# Patient Record
Sex: Male | Born: 1980 | Race: Black or African American | Hispanic: No | Marital: Married | Smoking: Current every day smoker
Health system: Southern US, Community
[De-identification: ages and names within clinical notes are randomized; demographics above are authoritative.]

## PROBLEM LIST (undated history)

## (undated) DIAGNOSIS — Z789 Other specified health status: Secondary | ICD-10-CM

## (undated) HISTORY — PX: NO PAST SURGERIES: SHX2092

---

## 2005-01-08 ENCOUNTER — Emergency Department (HOSPITAL_COMMUNITY): Admission: EM | Admit: 2005-01-08 | Discharge: 2005-01-08 | Payer: Self-pay | Admitting: Emergency Medicine

## 2008-01-29 ENCOUNTER — Inpatient Hospital Stay (HOSPITAL_COMMUNITY): Admission: AC | Admit: 2008-01-29 | Discharge: 2008-02-01 | Payer: Self-pay
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
## 2020-02-26 ENCOUNTER — Ambulatory Visit
Admission: EM | Admit: 2020-02-26 | Discharge: 2020-02-26 | Disposition: A | Discharge status: Home / Self Care | Payer: Self-pay | Attending: Physician Assistant | Admitting: Physician Assistant

## 2020-02-26 ENCOUNTER — Other Ambulatory Visit: Payer: Self-pay

## 2020-02-26 DIAGNOSIS — G510 Bell's palsy: Secondary | ICD-10-CM

## 2020-02-26 MED ORDER — PREDNISONE 20 MG PO TABS: 60.0000 mg | ORAL_TABLET | Freq: Every day | ORAL | 0 refills | Status: AC

## 2020-02-26 NOTE — ED Provider Notes (Signed)
[facility]    CSN: [id number] Arrival date & time: 02/26/20  1249      History   Chief Complaint Chief Complaint  Patient presents with  . Facial Droop    HPI Mark Dodson is a 39 y.o. male.   39 year old male comes in with wife for 4-5 day history of right facial drooping. States unable to close eye completely. Had trouble speaking at first due to trouble moving right side of lips. Denies vision changes, one sided extremity weakness, changes in balance, confusion. Denies URI symptoms, fever. Does have dental pain with worries for abscess.      History reviewed. No pertinent past medical history.  There are no problems to display for this patient.   History reviewed. No pertinent surgical history.     Home Medications    Prior to Admission medications   Medication Sig Start Date End Date Taking? Authorizing Provider  predniSONE (DELTASONE) 20 MG tablet Take 3 tablets (60 mg total) by mouth daily with breakfast for 7 days. 02/26/20 03/04/20  Belinda Fisher, PA-C    Family History Family History  Problem Relation Age of Onset  . Thyroid disease Mother   . Kidney disease Father     Social History Social History   Tobacco Use  . Smoking status: Current Every Day Smoker  . Smokeless tobacco: Never Used  Substance Use Topics  . Alcohol use: Yes  . Drug use: Yes    Types: Marijuana     Allergies   Patient has no known allergies.   Review of Systems Review of Systems  Reason unable to perform ROS: See HPI as above.     Physical Exam Triage Vital Signs ED Triage Vitals  Enc Vitals Group     BP      Pulse      Resp      Temp      Temp src      SpO2      Weight      Height      Head Circumference      Peak Flow      Pain Score      Pain Loc      Pain Edu?      Excl. in GC?    No data found.  Updated Vital Signs BP 137/89   Pulse 72   Temp 98.2 F (36.8 C) (Oral)   Resp 14   SpO2 98%   Physical Exam Constitutional:        General: He is not in acute distress.    Appearance: Normal appearance. He is not ill-appearing, toxic-appearing or diaphoretic.  HENT:     Head: Normocephalic and atraumatic.     Mouth/Throat:     Mouth: Mucous membranes are moist.     Pharynx: Oropharynx is clear. Uvula midline.     Comments: No dental tenderness to palpation. No fluctuance felt.  Eyes:     Extraocular Movements: Extraocular movements intact.     Conjunctiva/sclera: Conjunctivae normal.     Pupils: Pupils are equal, round, and reactive to light.  Cardiovascular:     Rate and Rhythm: Normal rate and regular rhythm.     Heart sounds: Normal heart sounds. No murmur heard.  No friction rub. No gallop.   Pulmonary:     Effort: Pulmonary effort is normal. No accessory muscle usage, prolonged expiration, respiratory distress or retractions.     Comments: Lungs clear to auscultation without adventitious  lung sounds. Musculoskeletal:     Cervical back: Normal range of motion and neck supple.  Neurological:     General: No focal deficit present.     Mental Status: He is alert and oriented to person, place, and time.     Comments: Right sided facial droop. Unable to raise right eyebrow, close right eye. No other deficits. Strength 5/5 bilaterally for upper and lower extremity. Sensation intact. Normal coordination with normal finger to nose, heel to shin. Negative pronator drift, romberg. Gait intact. Able to ambulate on own without difficulty.        UC Treatments / Results  Labs (all labs ordered are listed, but only abnormal results are displayed) Labs Reviewed - No data to display  EKG   Radiology No results found.  Procedures Procedures (including critical care time)  Medications Ordered in UC Medications - No data to display  Initial Impression / Assessment and Plan / UC Course  I have reviewed the triage vital signs and the nursing notes.  Pertinent labs & imaging results that were available  during my care of the patient were reviewed by me and considered in my medical decision making (see chart for details).    History and exam consistent with bell's palsy. Prednisone as directed. Other symptomatic treatment as discussed. Return precautions given.  Final Clinical Impressions(s) / UC Diagnoses   Final diagnoses:  Bell's palsy   ED Prescriptions    Medication Sig Dispense Auth. Provider   predniSONE (DELTASONE) 20 MG tablet Take 3 tablets (60 mg total) by mouth daily with breakfast for 7 days. 21 tablet Belinda Fisher, PA-C     PDMP not reviewed this encounter.   Belinda Fisher, PA-C 02/26/20 1540

## 2020-02-26 NOTE — Discharge Instructions (Signed)
Start prednisone as directed. Use artifical tear gel (systane, genteal brands)/drops for dry eyes. Tape eye down at night to sleep. If having worsening symptoms with confusion, one sided weakness, off balance, go to the emergency department for further evaluation.

## 2020-02-26 NOTE — ED Triage Notes (Addendum)
Patient presents with right sided facial droop that he states he noticed about 4-5 days ago. He does note inability to close right eye and he did have some slurred speech at onset.  He reports also having a abscessed tooth on a the right upper molar.

## 2024-02-11 ENCOUNTER — Ambulatory Visit (HOSPITAL_COMMUNITY)
Admission: EM | Admit: 2024-02-11 | Discharge: 2024-02-12 | Disposition: A | Discharge status: Other Acute Inpatient Hospital | Attending: Nurse Practitioner | Admitting: Nurse Practitioner

## 2024-02-11 DIAGNOSIS — F1010 Alcohol abuse, uncomplicated: Secondary | ICD-10-CM | POA: Diagnosis not present

## 2024-02-11 DIAGNOSIS — F22 Delusional disorders: Secondary | ICD-10-CM

## 2024-02-11 DIAGNOSIS — F323 Major depressive disorder, single episode, severe with psychotic features: Secondary | ICD-10-CM | POA: Insufficient documentation

## 2024-02-11 DIAGNOSIS — F1994 Other psychoactive substance use, unspecified with psychoactive substance-induced mood disorder: Secondary | ICD-10-CM | POA: Insufficient documentation

## 2024-02-11 DIAGNOSIS — R443 Hallucinations, unspecified: Secondary | ICD-10-CM

## 2024-02-11 DIAGNOSIS — F209 Schizophrenia, unspecified: Secondary | ICD-10-CM

## 2024-02-11 LAB — TSH: TSH: 0.454 u[IU]/mL (ref 0.350–4.500)

## 2024-02-11 LAB — POCT URINE DRUG SCREEN - MANUAL ENTRY (I-SCREEN)
POC Amphetamine UR: NOT DETECTED
POC Buprenorphine (BUP): NOT DETECTED
POC Cocaine UR: NOT DETECTED
POC Marijuana UR: POSITIVE — AB
POC Methadone UR: NOT DETECTED
POC Methamphetamine UR: NOT DETECTED
POC Morphine: NOT DETECTED
POC Oxazepam (BZO): NOT DETECTED
POC Oxycodone UR: NOT DETECTED
POC Secobarbital (BAR): NOT DETECTED

## 2024-02-11 LAB — CBC WITH DIFFERENTIAL/PLATELET
Abs Immature Granulocytes: 0.02 K/uL (ref 0.00–0.07)
Basophils Absolute: 0 K/uL (ref 0.0–0.1)
Basophils Relative: 1 %
Eosinophils Absolute: 0 K/uL (ref 0.0–0.5)
Eosinophils Relative: 0 %
HCT: 36.6 % — ABNORMAL LOW (ref 39.0–52.0)
Hemoglobin: 13.3 g/dL (ref 13.0–17.0)
Immature Granulocytes: 0 %
Lymphocytes Relative: 20 %
Lymphs Abs: 1 K/uL (ref 0.7–4.0)
MCH: 32.8 pg (ref 26.0–34.0)
MCHC: 36.3 g/dL — ABNORMAL HIGH (ref 30.0–36.0)
MCV: 90.1 fL (ref 80.0–100.0)
Monocytes Absolute: 0.5 K/uL (ref 0.1–1.0)
Monocytes Relative: 10 %
Neutro Abs: 3.4 K/uL (ref 1.7–7.7)
Neutrophils Relative %: 69 %
Platelets: 123 K/uL — ABNORMAL LOW (ref 150–400)
RBC: 4.06 MIL/uL — ABNORMAL LOW (ref 4.22–5.81)
RDW: 13 % (ref 11.5–15.5)
WBC: 4.9 K/uL (ref 4.0–10.5)
nRBC: 0 % (ref 0.0–0.2)

## 2024-02-11 LAB — COMPREHENSIVE METABOLIC PANEL WITH GFR
ALT: 95 U/L — ABNORMAL HIGH (ref 0–44)
AST: 149 U/L — ABNORMAL HIGH (ref 15–41)
Albumin: 4.1 g/dL (ref 3.5–5.0)
Alkaline Phosphatase: 71 U/L (ref 38–126)
Anion gap: 16 — ABNORMAL HIGH (ref 5–15)
BUN: 5 mg/dL — ABNORMAL LOW (ref 6–20)
CO2: 24 mmol/L (ref 22–32)
Calcium: 9.6 mg/dL (ref 8.9–10.3)
Chloride: 100 mmol/L (ref 98–111)
Creatinine, Ser: 0.64 mg/dL (ref 0.61–1.24)
GFR, Estimated: >60 mL/min (ref >60)
Glucose, Bld: 98 mg/dL (ref 70–99)
Potassium: 3.8 mmol/L (ref 3.5–5.1)
Sodium: 140 mmol/L (ref 135–145)
Total Bilirubin: 1 mg/dL (ref 0.0–1.2)
Total Protein: 7.2 g/dL (ref 6.5–8.1)

## 2024-02-11 LAB — LIPID PANEL
Cholesterol: 213 mg/dL — ABNORMAL HIGH (ref 0–200)
HDL: 122 mg/dL (ref >40)
LDL Cholesterol: 77 mg/dL (ref 0–99)
Total CHOL/HDL Ratio: 1.7 ratio
Triglycerides: 68 mg/dL (ref <150)
VLDL: 14 mg/dL (ref 0–40)

## 2024-02-11 LAB — HEMOGLOBIN A1C
Hgb A1c MFr Bld: 5.9 % — ABNORMAL HIGH (ref 4.8–5.6)
Mean Plasma Glucose: 122.63 mg/dL

## 2024-02-11 LAB — ETHANOL: Alcohol, Ethyl (B): 177 mg/dL — ABNORMAL HIGH (ref <15)

## 2024-02-11 MED ORDER — LORAZEPAM 2 MG/ML IJ SOLN: 2.0000 mg | Freq: Three times a day (TID) | INTRAMUSCULAR | Status: DC | PRN

## 2024-02-11 MED ORDER — HALOPERIDOL LACTATE 5 MG/ML IJ SOLN: 5.0000 mg | Freq: Three times a day (TID) | INTRAMUSCULAR | Status: DC | PRN

## 2024-02-11 MED ORDER — OLANZAPINE 5 MG PO TABS
5.0000 mg | ORAL_TABLET | Freq: Every day | ORAL | Status: DC
  Administered 2024-02-11: 5 mg via ORAL
  Filled 2024-02-11: qty 1

## 2024-02-11 MED ORDER — ACETAMINOPHEN 325 MG PO TABS: 650.0000 mg | ORAL_TABLET | Freq: Four times a day (QID) | ORAL | Status: DC | PRN

## 2024-02-11 MED ORDER — DIPHENHYDRAMINE HCL 50 MG/ML IJ SOLN: 50.0000 mg | Freq: Three times a day (TID) | INTRAMUSCULAR | Status: DC | PRN

## 2024-02-11 MED ORDER — MAGNESIUM HYDROXIDE 400 MG/5ML PO SUSP: 30.0000 mL | Freq: Every day | ORAL | Status: DC | PRN

## 2024-02-11 MED ORDER — CHLORDIAZEPOXIDE HCL 25 MG PO CAPS: 25.0000 mg | ORAL_CAPSULE | Freq: Four times a day (QID) | ORAL | Status: DC | PRN

## 2024-02-11 MED ORDER — HYDROXYZINE HCL 25 MG PO TABS: 25.0000 mg | ORAL_TABLET | Freq: Four times a day (QID) | ORAL | Status: DC | PRN

## 2024-02-11 MED ORDER — ALUM & MAG HYDROXIDE-SIMETH 200-200-20 MG/5ML PO SUSP: 30.0000 mL | ORAL | Status: DC | PRN

## 2024-02-11 MED ORDER — HALOPERIDOL 5 MG PO TABS: 5.0000 mg | ORAL_TABLET | Freq: Three times a day (TID) | ORAL | Status: DC | PRN

## 2024-02-11 MED ORDER — HYDROXYZINE HCL 25 MG PO TABS
25.0000 mg | ORAL_TABLET | Freq: Three times a day (TID) | ORAL | Status: DC | PRN
  Administered 2024-02-11: 25 mg via ORAL
  Filled 2024-02-11: qty 1

## 2024-02-11 MED ORDER — TRAZODONE HCL 50 MG PO TABS
50.0000 mg | ORAL_TABLET | Freq: Every evening | ORAL | Status: DC | PRN
  Administered 2024-02-11: 50 mg via ORAL
  Filled 2024-02-11: qty 1

## 2024-02-11 MED ORDER — CHLORDIAZEPOXIDE HCL 25 MG PO CAPS
25.0000 mg | ORAL_CAPSULE | Freq: Four times a day (QID) | ORAL | Status: DC
  Administered 2024-02-12 (×2): 25 mg via ORAL
  Filled 2024-02-11 (×2): qty 1

## 2024-02-11 MED ORDER — CHLORDIAZEPOXIDE HCL 25 MG PO CAPS: 25.0000 mg | ORAL_CAPSULE | Freq: Three times a day (TID) | ORAL | Status: DC

## 2024-02-11 MED ORDER — CHLORDIAZEPOXIDE HCL 25 MG PO CAPS: 25.0000 mg | ORAL_CAPSULE | ORAL | Status: DC

## 2024-02-11 MED ORDER — ADULT MULTIVITAMIN W/MINERALS CH
1.0000 | ORAL_TABLET | Freq: Every day | ORAL | Status: DC
  Administered 2024-02-12: 1 via ORAL
  Filled 2024-02-11: qty 1

## 2024-02-11 MED ORDER — DIPHENHYDRAMINE HCL 50 MG PO CAPS: 50.0000 mg | ORAL_CAPSULE | Freq: Three times a day (TID) | ORAL | Status: DC | PRN

## 2024-02-11 MED ORDER — HALOPERIDOL LACTATE 5 MG/ML IJ SOLN: 10.0000 mg | Freq: Three times a day (TID) | INTRAMUSCULAR | Status: DC | PRN

## 2024-02-11 MED ORDER — ONDANSETRON 4 MG PO TBDP: 4.0000 mg | ORAL_TABLET | Freq: Four times a day (QID) | ORAL | Status: DC | PRN

## 2024-02-11 MED ORDER — LOPERAMIDE HCL 2 MG PO CAPS: 2.0000 mg | ORAL_CAPSULE | ORAL | Status: DC | PRN

## 2024-02-11 MED ORDER — CHLORDIAZEPOXIDE HCL 25 MG PO CAPS: 25.0000 mg | ORAL_CAPSULE | Freq: Every day | ORAL | Status: DC

## 2024-02-11 MED ORDER — THIAMINE HCL 100 MG/ML IJ SOLN
100.0000 mg | Freq: Once | INTRAMUSCULAR | Status: AC
  Administered 2024-02-12: 100 mg via INTRAMUSCULAR
  Filled 2024-02-11: qty 2

## 2024-02-11 NOTE — ED Notes (Signed)
 Patient currently sleeping and resting in recliner. RR even and unlabored, appearing in no noted distress. Environmental check complete

## 2024-02-11 NOTE — ED Provider Notes (Signed)
 [facility] Continuous Assessment Admission H&P  Date: 02/12/24 Patient Name: Mark Dodson MRN: [id number] Chief Complaint:  I'm  hearing things like somebody is trying to harm me..  Diagnoses:  Final diagnoses:  Paranoid ideation (HCC)  Hallucinations  Substance induced mood disorder (HCC)  Alcohol abuse    HPI: Mark Dodson is a 43 year old male with no documented psychiatric history, who presented voluntarily as a walk in to [facility] accompanied by his wife with complaints of paranoia, visual and auditory hallucinations  for about three weeks.   Patient was seen face to face by this provider and chart reviewed. Patient gave permission for his wife to remain in the room during this evaluation.   On presentation, patient appears anxious and reports I'm hearing things like somebody is after me, and the voices are saying they gonna kill me, do harm to me, beat the hell out of me, I feel like somebody is trying to do something to me.   Patient also reports seeing people through his windows and in the compound who are there to harm him. Patient denies knowledge of having any conflict with anyone.  He reports being at work today and was unable to take it, because he really felt like he was in danger.  He had to call his wife to get him help.  Patient presented to the facility in his work clothes.   Patient endorses alcohol abuse and cigarette smoking. He reports drinking 2-16 ounce beers with 4 shots of Hennessy or vodka daily.  He reports smoking half a pack of cigarettes daily.  Patient reports hearing a voice during the evaluation, and describes the voice as one of those trying to get him.  Patient and his wife are currently separated but he visits during the weekends to spend time with the kids.  Patient is not established with outpatient psychiatric services for medication management or therapy.  He does not take any medications.  Patient has no history of inpatient  psychiatric hospitalization.  On evaluation, patient is alert, oriented x 3, and cooperative. Speech is clear, and coherent. Pt appears appropriately dressed. Eye contact is good. Mood is anxious, affect is congruent with mood. Thought process is coherent and thought content is WDL. Pt denies SI/HI, endorses AVH. There is no objective indication that the patient is responding to internal stimuli. No delusions elicited during this assessment.    Collateral information is obtained from the patient's wife who reports this situation has been ongoing for about 3 weeks. She reports lying on the couch on e day and she heard a knock on her door. When she answered, her brother and the police were out there stating her husband had called saying someone was outside threatening to kill him.  The all searched around and found no one and even looked at the neighbors cameras but there was no one.  Patient eventually left with her brother.   She reports getting a call from the patient a week later when she was at work.  She states he sounded really terrified and asked her to come get him because he was in danger as people were trying to kill him.  She reports going over to his work and he refused to sit inside the car but opted to get in the trunk for his safety before they could drive home. She reports the situation repeated itself yesterday when he was at work and called again, shaking and scared and said he can't take it  anymore and he needed help. She reports when they got to the car outside the facility, the patient was hypervigilant and scared.  He asked her to look around the corners of the building to ensure nobody was hiding that could be a danger to him.  She reports at some point during these episodes, the patient would report seeing people outside the bathroom window or in the compound who are there to harm him. She reports he specifically stated he saw 3 people outside the bathroom window and when they checked  the cameras and outside there was no one there.  She describes the patient as very nonconfrontational and lives a routine life.  She reports they have been together for 8 years and this is the first time he is experiencing this. They both deny a family history of mental illness or schizophrenia.  She reports his quality of life has been affected greatly.   Discussed recommendation for inpatient psychiatric admission for stabilization and treatment.  Discussed inpatient milieu and expectations.  Patient and his wife were provided with opportunity for questions.  They verbalized understanding and are in agreement. Patient admitted to the continuous observation unit for safety monitoring pending transfer to an inpatient psychiatric unit. LCSW will seek bed placement.  Total Time spent with patient: 30 minutes  Musculoskeletal  Strength & Muscle Tone: within normal limits Gait & Station: normal Patient leans: N/A  Psychiatric Specialty Exam  Presentation General Appearance:  Casual  Eye Contact: Good  Speech: Clear and Coherent  Speech Volume: Normal  Handedness: Right   Mood and Affect  Mood: Anxious  Affect: Congruent   Thought Process  Thought Processes: Coherent  Descriptions of Associations:Intact  Orientation:Full (Time, Place and Person)  Thought Content:Paranoid Ideation  Diagnosis of Schizophrenia or Schizoaffective disorder in past: No   Hallucinations:Hallucinations: Auditory; Visual Description of Auditory Hallucinations: Pt reports hearing voices threatning to harm and kill him Description of Visual Hallucinations: Patient also reports seeing people around his house threatning to harm him.  Ideas of Reference:Paranoia; Percusatory  Suicidal Thoughts:Suicidal Thoughts: No  Homicidal Thoughts:Homicidal Thoughts: No   Sensorium  Memory: Immediate Fair  Judgment: Fair  Insight: Fair   Producer, television/film/video: Fair  Attention Span: Fair  Recall: [employer] of Knowledge: Fair  Language: Fair   Psychomotor Activity  Psychomotor Activity: Psychomotor Activity: Normal   Assets  Assets: Manufacturing systems engineer; Desire for Improvement   Sleep  Sleep: Sleep: Poor   Nutritional Assessment (For OBS and FBC admissions only) Has the patient had a weight loss or gain of 10 pounds or more in the last 3 months?: No Has the patient had a decrease in food intake/or appetite?: No Does the patient have dental problems?: No Does the patient have eating habits or behaviors that may be indicators of an eating disorder including binging or inducing vomiting?: No Has the patient recently lost weight without trying?: 0 Has the patient been eating poorly because of a decreased appetite?: 0 Malnutrition Screening Tool Score: 0    Physical Exam Constitutional:      General: He is not in acute distress.    Appearance: He is not diaphoretic.  HENT:     Nose: No congestion.  Cardiovascular:     Rate and Rhythm: Normal rate.  Pulmonary:     Effort: No respiratory distress.  Chest:     Chest wall: No tenderness.  Neurological:     General: No focal deficit present.  Mental Status: He is alert.  Psychiatric:        Attention and Perception: He perceives auditory and visual hallucinations.        Mood and Affect: Mood is anxious.        Speech: Speech normal.        Behavior: Behavior is actively hallucinating. Behavior is cooperative.        Thought Content: Thought content is paranoid.    Review of Systems  Constitutional:  Negative for chills, diaphoresis and fever.  HENT:  Negative for congestion.   Eyes:  Negative for discharge.  Respiratory:  Negative for cough, shortness of breath and wheezing.   Cardiovascular:  Negative for chest pain and palpitations.  Gastrointestinal:  Negative for diarrhea, nausea and vomiting.  Neurological:  Negative for dizziness,  seizures, loss of consciousness and headaches.  Psychiatric/Behavioral:  Positive for hallucinations and substance abuse. The patient is nervous/anxious and has insomnia.     Blood pressure 115/67, pulse 84, temperature 98.1 F (36.7 C), temperature source Oral, resp. rate 16, SpO2 96%. There is no height or weight on file to calculate BMI.  Past Psychiatric History: See H & P   Is the patient at risk to self? Yes  Has the patient been a risk to self in the past 6 months? No .    Has the patient been a risk to self within the distant past? No   Is the patient a risk to others? No   Has the patient been a risk to others in the past 6 months? No   Has the patient been a risk to others within the distant past? No   Past Medical History: See Chart  Family History: N/A  Social History: N/A  Last Labs:  Admission on 02/11/2024  Component Date Value Ref Range Status   WBC 02/11/2024 4.9  4.0 - 10.5 K/uL Final   RBC 02/11/2024 4.06 (L)  4.22 - 5.81 MIL/uL Final   Hemoglobin 02/11/2024 13.3  13.0 - 17.0 g/dL Final   HCT 90/95/7974 36.6 (L)  39.0 - 52.0 % Final   MCV 02/11/2024 90.1  80.0 - 100.0 fL Final   MCH 02/11/2024 32.8  26.0 - 34.0 pg Final   MCHC 02/11/2024 36.3 (H)  30.0 - 36.0 g/dL Final   RDW 90/95/7974 13.0  11.5 - 15.5 % Final   Platelets 02/11/2024 123 (L)  150 - 400 K/uL Final   nRBC 02/11/2024 0.0  0.0 - 0.2 % Final   Neutrophils Relative % 02/11/2024 69  % Final   Neutro Abs 02/11/2024 3.4  1.7 - 7.7 K/uL Final   Lymphocytes Relative 02/11/2024 20  % Final   Lymphs Abs 02/11/2024 1.0  0.7 - 4.0 K/uL Final   Monocytes Relative 02/11/2024 10  % Final   Monocytes Absolute 02/11/2024 0.5  0.1 - 1.0 K/uL Final   Eosinophils Relative 02/11/2024 0  % Final   Eosinophils Absolute 02/11/2024 0.0  0.0 - 0.5 K/uL Final   Basophils Relative 02/11/2024 1  % Final   Basophils Absolute 02/11/2024 0.0  0.0 - 0.1 K/uL Final   Immature Granulocytes 02/11/2024 0  % Final   Abs  Immature Granulocytes 02/11/2024 0.02  0.00 - 0.07 K/uL Final   Performed at [facility], [address], [place], [place] [address]   Sodium 02/11/2024 140  135 - 145 mmol/L Final   Potassium 02/11/2024 3.8  3.5 - 5.1 mmol/L Final   Chloride 02/11/2024 100  98 - 111 mmol/L Final   CO2 02/11/2024 24  22 - 32 mmol/L Final   Glucose, Bld 02/11/2024 98  70 - 99 mg/dL Final   Glucose reference range applies only to samples taken after fasting for at least 8 hours.   BUN 02/11/2024 5 (L)  6 - 20 mg/dL Final   Creatinine, Ser 02/11/2024 0.64  0.61 - 1.24 mg/dL Final   Calcium 90/95/7974 9.6  8.9 - 10.3 mg/dL Final   Total Protein 90/95/7974 7.2  6.5 - 8.1 g/dL Final   Albumin 90/95/7974 4.1  3.5 - 5.0 g/dL Final   AST 90/95/7974 149 (H)  15 - 41 U/L Final   ALT 02/11/2024 95 (H)  0 - 44 U/L Final   Alkaline Phosphatase 02/11/2024 71  38 - 126 U/L Final   Total Bilirubin 02/11/2024 1.0  0.0 - 1.2 mg/dL Final   GFR, Estimated 02/11/2024 >60  >60 mL/min Final   Comment: (NOTE) Calculated using the CKD-EPI Creatinine Equation (2021)    Anion gap 02/11/2024 16 (H)  5 - 15 Final   Performed at [facility], [address] [place], [place] [address]   Hgb A1c MFr Bld 02/11/2024 5.9 (H)  4.8 - 5.6 % Final   Comment: (NOTE) Diagnosis of Diabetes The following HbA1c ranges recommended by the American Diabetes Association (ADA) may be used as an aid in the diagnosis of diabetes mellitus.  Hemoglobin             Suggested A1C NGSP%              Diagnosis  <5.7                   Non Diabetic  5.7-6.4                Pre-Diabetic  >6.4                   Diabetic  <7.0                   Glycemic control for                       adults with diabetes.     Mean Plasma Glucose 02/11/2024 122.63  mg/dL Final   Performed at [facility], [address] [place], [place] [address]   Cholesterol 02/11/2024 213 (H)  0 - 200 mg/dL Final   Triglycerides 90/95/7974 68  <150 mg/dL  Final   HDL 90/95/7974 122  >40 mg/dL Final   Total CHOL/HDL Ratio 02/11/2024 1.7  RATIO Final   VLDL 02/11/2024 14  0 - 40 mg/dL Final   LDL Cholesterol 02/11/2024 77  0 - 99 mg/dL Final   Comment:        Total Cholesterol/HDL:CHD Risk Coronary Heart Disease Risk Table                     Men   Women  1/2 Average Risk   3.4   3.3  Average Risk       5.0   4.4  2 X Average Risk   9.6   7.1  3 X Average Risk  23.4   11.0        Use the calculated Patient Ratio above and the CHD Risk Table to determine the patient's CHD Risk.        ATP III CLASSIFICATION (LDL):  <100  mg/dL   Optimal  899-870  mg/dL   Near or Above                    Optimal  130-159  mg/dL   Borderline  839-810  mg/dL   High  >809     mg/dL   Very High Performed at [facility], [address], [place], [place] [address]    Alcohol, Ethyl (B) 02/11/2024 177 (H)  <15 mg/dL Final   Comment: (NOTE) For medical purposes only. Performed at [facility], [address] [place], [place] [address]    POC Amphetamine UR 02/11/2024 None Detected  NONE DETECTED (Cut Off Level 1000 ng/mL) Final   POC Secobarbital (BAR) 02/11/2024 None Detected  NONE DETECTED (Cut Off Level 300 ng/mL) Final   POC Buprenorphine (BUP) 02/11/2024 None Detected  NONE DETECTED (Cut Off Level 10 ng/mL) Final   POC Oxazepam (BZO) 02/11/2024 None Detected  NONE DETECTED (Cut Off Level 300 ng/mL) Final   POC Cocaine UR 02/11/2024 None Detected  NONE DETECTED (Cut Off Level 300 ng/mL) Final   POC Methamphetamine UR 02/11/2024 None Detected  NONE DETECTED (Cut Off Level 1000 ng/mL) Final   POC Morphine 02/11/2024 None Detected  NONE DETECTED (Cut Off Level 300 ng/mL) Final   POC Methadone UR 02/11/2024 None Detected  NONE DETECTED (Cut Off Level 300 ng/mL) Final   POC Oxycodone UR 02/11/2024 None Detected  NONE DETECTED (Cut Off Level 100 ng/mL) Final   POC Marijuana UR 02/11/2024 Positive (A)  NONE DETECTED (Cut Off Level 50 ng/mL)  Final   TSH 02/11/2024 0.454  0.350 - 4.500 uIU/mL Final   Comment: Performed by a 3rd Generation assay with a functional sensitivity of <=0.01 uIU/mL. Performed at [facility], [address], [place], [place] [address]     Allergies: Patient has no known allergies.  Medications:  Facility Ordered Medications  Medication   acetaminophen  (TYLENOL ) tablet 650 mg   alum & mag hydroxide-simeth (MAALOX/MYLANTA) 200-200-20 MG/5ML suspension 30 mL   magnesium  hydroxide (MILK OF MAGNESIA) suspension 30 mL   haloperidol  (HALDOL ) tablet 5 mg   And   diphenhydrAMINE  (BENADRYL ) capsule 50 mg   haloperidol  lactate (HALDOL ) injection 5 mg   And   diphenhydrAMINE  (BENADRYL ) injection 50 mg   And   LORazepam  (ATIVAN ) injection 2 mg   haloperidol  lactate (HALDOL ) injection 10 mg   And   diphenhydrAMINE  (BENADRYL ) injection 50 mg   And   LORazepam  (ATIVAN ) injection 2 mg   traZODone  (DESYREL ) tablet 50 mg   OLANZapine  (ZYPREXA ) tablet 5 mg   [COMPLETED] thiamine  (VITAMIN B1) injection 100 mg   multivitamin with minerals tablet 1 tablet   chlordiazePOXIDE  (LIBRIUM ) capsule 25 mg   hydrOXYzine  (ATARAX ) tablet 25 mg   loperamide  (IMODIUM ) capsule 2-4 mg   ondansetron  (ZOFRAN -ODT) disintegrating tablet 4 mg   chlordiazePOXIDE  (LIBRIUM ) capsule 25 mg   Followed by   NOREEN ON 02/13/2024] chlordiazePOXIDE  (LIBRIUM ) capsule 25 mg   Followed by   NOREEN ON 02/14/2024] chlordiazePOXIDE  (LIBRIUM ) capsule 25 mg   Followed by   NOREEN ON 02/15/2024] chlordiazePOXIDE  (LIBRIUM ) capsule 25 mg   nicotine  (NICODERM CQ  - dosed in mg/24 hours) patch 14 mg    Screenings    Flowsheet Row Most Recent Value  CIWA-Ar Total 4    Medical Decision Making  Recommend inpatient psychiatric admission for stabilization and treatment.  Lab Orders  CBC with Differential/Platelet         Comprehensive metabolic panel         Hemoglobin A1c         Lipid panel         Ethanol         TSH          POCT Urine Drug Screen - (I-Screen)     EKG  Medication started -Olanzapine  5 mg PO daily for AH/Mood stabilization -Nicoderm CQ  14 mg, transdermal 24 /hr patch for nicotine  dependence.   Recommend CIWA Protocol-Alcohol abuse-BAL 177  Other Prns -Tylenol , Maalox, MOM, Trazodone  -Agitation protocol medications  Recommendations  Based on my evaluation the patient does not appear to have an emergency medical condition.  Recommend inpatient psychiatric admission for stabilization and treatment.  Thurman LULLA Ivans, NP 02/12/24  2:29 AM

## 2024-02-11 NOTE — Progress Notes (Signed)
   02/11/24 1953  [facility] Triage Screening (Walk-ins at [facility] only)  How Did You Hear About Us ? Family/Friend  What Is the Reason for Your Visit/Call Today? Pt presents to [facility] as a voluntary walk-in, accompanied by his wife with complaint of paranoia and auditory hallucinations. Pt reports experiencing AH for to weeks, but seemed to worsen today while at work. Pt denies visual hallucinations. He describes the voices saying  kill him, let him come outside. Pt also reports feeling scared to go outside and do normal things he usually does while home. He reports feeling like someone is out to get him while he stepped outside a few minutes ago. Pt denies psychiatric history, prior suicidea attempts, or self-injurious behaviors. Pt currently denies SI,HI,VH, and substance/alcohol use.  How Long Has This Been Causing You Problems? 1 wk - 1 month  Have You Recently Had Any Thoughts About Hurting Yourself? No  Are You Planning to Commit Suicide/Harm Yourself At This time? No  Have you Recently Had Thoughts About Hurting Someone Sherral? No  Are You Planning To Harm Someone At This Time? No  Physical Abuse Denies  Verbal Abuse Denies  Sexual Abuse Denies  Exploitation of patient/patient's resources Denies  Self-Neglect Denies  Are you currently experiencing any auditory, visual or other hallucinations? Yes  Please explain the hallucinations you are currently experiencing: AH-voices telling him to come outside  Have You Used Any Alcohol or Drugs in the Past 24 Hours? No  Do you have any current medical co-morbidities that require immediate attention? No  Clinician description of patient physical appearance/behavior: cooperative, pleasant, appears paranoid. Asked Clinical research associate several times  you don't hear them outside?  What Do You Feel Would Help You the Most Today? Treatment for Depression or other mood problem  If access to [facility] was not available, would you have sought care in the Emergency Department?  Yes  Determination of Need Urgent (48 hours)  Options For Referral Other: Comment;BH Urgent Care;Outpatient Therapy;Medication Management;Inpatient Hospitalization  Determination of Need filed? Yes
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
## 2024-02-11 NOTE — ED Notes (Signed)
 Mark Dodson arrived to the [facility] for observation due to auditory hallucinations and paranoia. Patient is A&Ox4. Patient states what brought them into the facility is that was that he was hearing voices who were trying to get him and throughout the nursing assessment patient would repeatedly looked over to exits and doorways. Patient denies any suicidal ideations or homicidal ideations . Pt contracts for safety. Patient admits to auditory hallucinations and denies visual hallucinations and CAH. Skin check conducted by [name] and [name], MHT. Patient oriented to the unit and provided food, beverage, and snack. Patient verbalized understanding. Patient currently resting in bed. No distress noted.

## 2024-02-12 ENCOUNTER — Other Ambulatory Visit: Payer: Self-pay

## 2024-02-12 ENCOUNTER — Encounter (HOSPITAL_COMMUNITY): Payer: Self-pay

## 2024-02-12 ENCOUNTER — Inpatient Hospital Stay (HOSPITAL_COMMUNITY)
Admission: AD | Admit: 2024-02-12 | Discharge: 2024-02-14 | DRG: 885 | Disposition: A | Discharge status: Home / Self Care | Source: Intra-hospital

## 2024-02-12 DIAGNOSIS — F1290 Cannabis use, unspecified, uncomplicated: Secondary | ICD-10-CM | POA: Diagnosis not present

## 2024-02-12 DIAGNOSIS — Z813 Family history of other psychoactive substance abuse and dependence: Secondary | ICD-10-CM

## 2024-02-12 DIAGNOSIS — F323 Major depressive disorder, single episode, severe with psychotic features: Secondary | ICD-10-CM

## 2024-02-12 DIAGNOSIS — F10239 Alcohol dependence with withdrawal, unspecified: Secondary | ICD-10-CM | POA: Diagnosis present

## 2024-02-12 DIAGNOSIS — R748 Abnormal levels of other serum enzymes: Secondary | ICD-10-CM | POA: Diagnosis present

## 2024-02-12 DIAGNOSIS — F209 Schizophrenia, unspecified: Secondary | ICD-10-CM

## 2024-02-12 DIAGNOSIS — Z720 Tobacco use: Secondary | ICD-10-CM | POA: Insufficient documentation

## 2024-02-12 DIAGNOSIS — Z808 Family history of malignant neoplasm of other organs or systems: Secondary | ICD-10-CM | POA: Diagnosis not present

## 2024-02-12 DIAGNOSIS — F419 Anxiety disorder, unspecified: Secondary | ICD-10-CM | POA: Diagnosis present

## 2024-02-12 DIAGNOSIS — F17210 Nicotine dependence, cigarettes, uncomplicated: Secondary | ICD-10-CM | POA: Diagnosis present

## 2024-02-12 DIAGNOSIS — F29 Unspecified psychosis not due to a substance or known physiological condition: Secondary | ICD-10-CM | POA: Diagnosis not present

## 2024-02-12 DIAGNOSIS — Z811 Family history of alcohol abuse and dependence: Secondary | ICD-10-CM

## 2024-02-12 DIAGNOSIS — F1020 Alcohol dependence, uncomplicated: Secondary | ICD-10-CM | POA: Insufficient documentation

## 2024-02-12 DIAGNOSIS — F10939 Alcohol use, unspecified with withdrawal, unspecified: Secondary | ICD-10-CM

## 2024-02-12 HISTORY — DX: Other specified health status: Z78.9

## 2024-02-12 MED ORDER — CHLORDIAZEPOXIDE HCL 25 MG PO CAPS
25.0000 mg | ORAL_CAPSULE | ORAL | Status: DC
  Administered 2024-02-14: 25 mg via ORAL
  Filled 2024-02-12: qty 1

## 2024-02-12 MED ORDER — NICOTINE 14 MG/24HR TD PT24
14.0000 mg | MEDICATED_PATCH | Freq: Every day | TRANSDERMAL | Status: DC
  Filled 2024-02-12 (×2): qty 1

## 2024-02-12 MED ORDER — LORAZEPAM 2 MG/ML IJ SOLN: 2.0000 mg | Freq: Three times a day (TID) | INTRAMUSCULAR | Status: DC | PRN

## 2024-02-12 MED ORDER — ALUM & MAG HYDROXIDE-SIMETH 200-200-20 MG/5ML PO SUSP: 30.0000 mL | ORAL | Status: DC | PRN

## 2024-02-12 MED ORDER — NICOTINE POLACRILEX 2 MG MT GUM
2.0000 mg | CHEWING_GUM | OROMUCOSAL | Status: DC | PRN
  Administered 2024-02-12 – 2024-02-14 (×7): 2 mg via ORAL
  Filled 2024-02-12 (×2): qty 1

## 2024-02-12 MED ORDER — HALOPERIDOL LACTATE 5 MG/ML IJ SOLN: 5.0000 mg | Freq: Three times a day (TID) | INTRAMUSCULAR | Status: DC | PRN

## 2024-02-12 MED ORDER — DIPHENHYDRAMINE HCL 50 MG/ML IJ SOLN: 50.0000 mg | Freq: Three times a day (TID) | INTRAMUSCULAR | Status: DC | PRN

## 2024-02-12 MED ORDER — CHLORDIAZEPOXIDE HCL 25 MG PO CAPS: 25.0000 mg | ORAL_CAPSULE | Freq: Every day | ORAL | Status: DC

## 2024-02-12 MED ORDER — HALOPERIDOL 5 MG PO TABS: 5.0000 mg | ORAL_TABLET | Freq: Three times a day (TID) | ORAL | Status: DC | PRN

## 2024-02-12 MED ORDER — LOPERAMIDE HCL 2 MG PO CAPS: 2.0000 mg | ORAL_CAPSULE | ORAL | Status: DC | PRN

## 2024-02-12 MED ORDER — ADULT MULTIVITAMIN W/MINERALS CH
1.0000 | ORAL_TABLET | Freq: Every day | ORAL | Status: DC
  Administered 2024-02-13 – 2024-02-14 (×2): 1 via ORAL
  Filled 2024-02-12 (×2): qty 1

## 2024-02-12 MED ORDER — ONDANSETRON 4 MG PO TBDP: 4.0000 mg | ORAL_TABLET | Freq: Four times a day (QID) | ORAL | Status: DC | PRN

## 2024-02-12 MED ORDER — CHLORDIAZEPOXIDE HCL 25 MG PO CAPS
25.0000 mg | ORAL_CAPSULE | Freq: Four times a day (QID) | ORAL | Status: AC
  Administered 2024-02-12 (×2): 25 mg via ORAL
  Filled 2024-02-12 (×2): qty 1

## 2024-02-12 MED ORDER — CHLORDIAZEPOXIDE HCL 25 MG PO CAPS
25.0000 mg | ORAL_CAPSULE | Freq: Three times a day (TID) | ORAL | Status: AC
  Administered 2024-02-13 (×3): 25 mg via ORAL
  Filled 2024-02-12 (×3): qty 1

## 2024-02-12 MED ORDER — ACETAMINOPHEN 325 MG PO TABS: 650.0000 mg | ORAL_TABLET | Freq: Four times a day (QID) | ORAL | Status: DC | PRN

## 2024-02-12 MED ORDER — HYDROXYZINE HCL 10 MG PO TABS
10.0000 mg | ORAL_TABLET | Freq: Three times a day (TID) | ORAL | Status: DC | PRN
  Administered 2024-02-13: 10 mg via ORAL
  Filled 2024-02-12: qty 1

## 2024-02-12 MED ORDER — DIPHENHYDRAMINE HCL 25 MG PO CAPS: 50.0000 mg | ORAL_CAPSULE | Freq: Three times a day (TID) | ORAL | Status: DC | PRN

## 2024-02-12 MED ORDER — NICOTINE POLACRILEX 2 MG MT GUM: 2.0000 mg | CHEWING_GUM | OROMUCOSAL | Status: DC | PRN

## 2024-02-12 MED ORDER — HALOPERIDOL LACTATE 5 MG/ML IJ SOLN: 10.0000 mg | Freq: Three times a day (TID) | INTRAMUSCULAR | Status: DC | PRN

## 2024-02-12 MED ORDER — DIPHENHYDRAMINE HCL 50 MG/ML IJ SOLN
50.0000 mg | Freq: Three times a day (TID) | INTRAMUSCULAR | Status: DC | PRN
Start: 2024-02-12 — End: 2024-02-14

## 2024-02-12 MED ORDER — TRAZODONE HCL 50 MG PO TABS
50.0000 mg | ORAL_TABLET | Freq: Every evening | ORAL | Status: DC | PRN
  Administered 2024-02-12 – 2024-02-13 (×2): 50 mg via ORAL
  Filled 2024-02-12 (×2): qty 1

## 2024-02-12 MED ORDER — CHLORDIAZEPOXIDE HCL 25 MG PO CAPS: 25.0000 mg | ORAL_CAPSULE | Freq: Four times a day (QID) | ORAL | Status: DC | PRN

## 2024-02-12 MED ORDER — MAGNESIUM HYDROXIDE 400 MG/5ML PO SUSP: 30.0000 mL | Freq: Every day | ORAL | Status: DC | PRN

## 2024-02-12 NOTE — ED Notes (Signed)
 Patient Vol transferred to [facility] via safe transport. Pt A&Ox4, ambulatory w/ steady gait and VSS upon departure. All belongings and paperwork given to safe transport.

## 2024-02-12 NOTE — ED Provider Notes (Signed)
 FBC/OBS ASAP Discharge Summary  Date and Time: 02/12/2024 1:33 PM  Name: Mark Dodson  MRN:  [id number]   Discharge Diagnoses:  Final diagnoses:  Paranoid ideation (HCC)  Hallucinations  Substance induced mood disorder (HCC)  Alcohol abuse  MDD (major depressive disorder), single episode, severe with psychotic features (HCC)    Subjective: Mark Dodson is a 43 yo male with no past psychiatric history who presents from home with at least two months of worsening paranoia, auditory hallucinations.   He reports that over the last two months he has had a worsening sense that people are following him, working against him. He had been smoking marijuana regularly but started to notice it was making him more paranoid, so he reports that he stopped 3 months ago. He has continued to have worsening paranoia even after stopping THC. Having auditory hallucinations that people are coming to harm him. Inconsistent history about whether he is also having visual hallucinations.  Having hypervigilance and increased arousal.   Has been sleeping on the floor in the same room as his brother in law so that he is not alone.   He was dealing with this until he hit a breaking point at work the other night and told his boss that he was hearing things and needed help.   Patient is also an every day heavy drinker. Admits to significant difficulty controlling his drinking.  Records review indicates that there was a brief admission for hallucinations to [facility] in August 2025.  Workup did not include any imaging, they considered likely that it was THC induced.  Stay Summary: admitted overnight, recommended for inpatient at [facility].  Total Time spent with patient: 30 minutes Mode of transport to Hospital: Current Outpatient (Home) Medication List:  Past Psychiatric Hx: Previous Psych Diagnoses: Patient reports no psychiatric history, notes indicate that there have been several hallucinations in the last year  and increasing paranoia. Prior inpatient treatment: Denies Current/prior outpatient treatment: Denies Prior rehab hx: Denies Psychotherapy hx: Denies History of suicide: Denies History of homicide or aggression: Denies Psychiatric medication history: Denies, none documented Psychiatric medication compliance history: N/A Neuromodulation history: Denies Current Psychiatrist: Never Current therapist: Never  Substance Abuse Hx: Alcohol: every day alcohol user. 2 beers plus unknown number of shots. Every day. Functional alcoholic per patient. Contemplative state of change.  Tobacco: Every day 5-6 cigarettes per day Illicit drugs: THC only. Last reported use was 3 months ago (prior to paranoia worsening) However patient UDS positive for THC. Rx drug abuse: denies Rehab hx: no  Past Medical History: Medical Diagnoses: History of Bell's palsy Home Rx: None Prior Hosp: Hallucinations, Bell's palsy Prior Surgeries/Trauma: Denies Head trauma, LOC, concussions, seizures: Denies Allergies: No allergies  Family History: Medical: Thyroid cancer in his mother, Psych: None Psych Rx: Denies SA/HA: Denies Substance use family hx: Alcohol use in both parents, THC use and family  Social History: Childhood (bring, raised, lives now, parents, siblings, schooling, education): Born and raised in [place], has lived in [place] over the last 6 or 7 years. Abuse: Denies Marital Status: Married for the last 4 years to [employer]  Currently separated. Sexual orientation: Prefers women Children: 1 daughter turning 7 on October 1 Employment: Works as a Animal nutritionist at [employer]: Good friends with brother-in-law Housing: Currently living with brother-in-law Finances: No current strain Armed forces operational officer: No legal history Military: No Administrator, sports    Current Medications:  Current Facility-Administered Medications  Medication Dose Route Frequency Provider Last Rate  Last Admin   acetaminophen  (TYLENOL ) tablet 650 mg  650 mg Oral Q6H PRN Onuoha, Chinwendu V, NP       alum & mag hydroxide-simeth (MAALOX/MYLANTA) 200-200-20 MG/5ML suspension 30 mL  30 mL Oral Q4H PRN Onuoha, Chinwendu V, NP       chlordiazePOXIDE  (LIBRIUM ) capsule 25 mg  25 mg Oral Q6H PRN Onuoha, Chinwendu V, NP       chlordiazePOXIDE  (LIBRIUM ) capsule 25 mg  25 mg Oral QID Onuoha, Chinwendu V, NP   25 mg at 02/12/24 9082   Followed by   [START ON 02/13/2024] chlordiazePOXIDE  (LIBRIUM ) capsule 25 mg  25 mg Oral TID Onuoha, Chinwendu V, NP       Followed by   NOREEN ON 02/14/2024] chlordiazePOXIDE  (LIBRIUM ) capsule 25 mg  25 mg Oral BH-qamhs Onuoha, Chinwendu V, NP       Followed by   NOREEN ON 02/15/2024] chlordiazePOXIDE  (LIBRIUM ) capsule 25 mg  25 mg Oral Daily Onuoha, Chinwendu V, NP       haloperidol  (HALDOL ) tablet 5 mg  5 mg Oral TID PRN Onuoha, Chinwendu V, NP       And   diphenhydrAMINE  (BENADRYL ) capsule 50 mg  50 mg Oral TID PRN Onuoha, Chinwendu V, NP       haloperidol  lactate (HALDOL ) injection 5 mg  5 mg Intramuscular TID PRN Onuoha, Chinwendu V, NP       And   diphenhydrAMINE  (BENADRYL ) injection 50 mg  50 mg Intramuscular TID PRN Onuoha, Chinwendu V, NP       And   LORazepam  (ATIVAN ) injection 2 mg  2 mg Intramuscular TID PRN Onuoha, Chinwendu V, NP       haloperidol  lactate (HALDOL ) injection 10 mg  10 mg Intramuscular TID PRN Onuoha, Chinwendu V, NP       And   diphenhydrAMINE  (BENADRYL ) injection 50 mg  50 mg Intramuscular TID PRN Onuoha, Chinwendu V, NP       And   LORazepam  (ATIVAN ) injection 2 mg  2 mg Intramuscular TID PRN Onuoha, Chinwendu V, NP       hydrOXYzine  (ATARAX ) tablet 25 mg  25 mg Oral Q6H PRN Onuoha, Chinwendu V, NP       loperamide  (IMODIUM ) capsule 2-4 mg  2-4 mg Oral PRN Onuoha, Chinwendu V, NP       magnesium  hydroxide (MILK OF MAGNESIA) suspension 30 mL  30 mL Oral Daily PRN Onuoha, Chinwendu V, NP       multivitamin with minerals tablet 1 tablet  1  tablet Oral Daily Onuoha, Chinwendu V, NP   1 tablet at 02/12/24 9082   nicotine  polacrilex (NICORETTE ) gum 2 mg  2 mg Oral PRN Delsie Lynwood Morene Lavone, MD       OLANZapine  (ZYPREXA ) tablet 5 mg  5 mg Oral QHS Onuoha, Chinwendu V, NP   5 mg at 02/11/24 2236   ondansetron  (ZOFRAN -ODT) disintegrating tablet 4 mg  4 mg Oral Q6H PRN Onuoha, Chinwendu V, NP       traZODone  (DESYREL ) tablet 50 mg  50 mg Oral QHS PRN Onuoha, Chinwendu V, NP   50 mg at 02/11/24 2252   No current outpatient medications on file.    PTA Medications:  Facility Ordered Medications  Medication   acetaminophen  (TYLENOL ) tablet 650 mg   alum & mag hydroxide-simeth (MAALOX/MYLANTA) 200-200-20 MG/5ML suspension 30 mL   magnesium  hydroxide (MILK OF MAGNESIA) suspension 30 mL   haloperidol  (HALDOL ) tablet 5 mg   And  diphenhydrAMINE  (BENADRYL ) capsule 50 mg   haloperidol  lactate (HALDOL ) injection 5 mg   And   diphenhydrAMINE  (BENADRYL ) injection 50 mg   And   LORazepam  (ATIVAN ) injection 2 mg   haloperidol  lactate (HALDOL ) injection 10 mg   And   diphenhydrAMINE  (BENADRYL ) injection 50 mg   And   LORazepam  (ATIVAN ) injection 2 mg   traZODone  (DESYREL ) tablet 50 mg   OLANZapine  (ZYPREXA ) tablet 5 mg   [COMPLETED] thiamine  (VITAMIN B1) injection 100 mg   multivitamin with minerals tablet 1 tablet   chlordiazePOXIDE  (LIBRIUM ) capsule 25 mg   hydrOXYzine  (ATARAX ) tablet 25 mg   loperamide  (IMODIUM ) capsule 2-4 mg   ondansetron  (ZOFRAN -ODT) disintegrating tablet 4 mg   chlordiazePOXIDE  (LIBRIUM ) capsule 25 mg   Followed by   NOREEN ON 02/13/2024] chlordiazePOXIDE  (LIBRIUM ) capsule 25 mg   Followed by   NOREEN ON 02/14/2024] chlordiazePOXIDE  (LIBRIUM ) capsule 25 mg   Followed by   NOREEN ON 02/15/2024] chlordiazePOXIDE  (LIBRIUM ) capsule 25 mg   nicotine  polacrilex (NICORETTE ) gum 2 mg        No data to display          Flowsheet Row ED [facility]/11/2024 [facility]  C-SSRS  RISK CATEGORY No Risk    Musculoskeletal  Strength & Muscle Tone: within normal limits Gait & Station: normal Patient leans: N/A  Psychiatric Specialty Exam  Presentation  General Appearance:  Casual  Eye Contact: Good  Speech: Slow  Speech Volume: Decreased  Handedness: Right   Mood and Affect  Mood: Depressed; Anxious  Affect: Depressed; Blunt   Thought Process  Thought Processes: Coherent  Descriptions of Associations:Intact  Orientation:Full (Time, Place and Person)  Thought Content:Paranoid Ideation  Diagnosis of Schizophrenia or Schizoaffective disorder in past: No  Duration of Psychotic Symptoms: Less than six months   Hallucinations:Hallucinations: Auditory Description of Auditory Hallucinations: Hearing voices threatening to harm him. Description of Visual Hallucinations: Denied visual hallucinations this morning.  Ideas of Reference:Paranoia; Percusatory  Suicidal Thoughts:Suicidal Thoughts: No  Homicidal Thoughts:Homicidal Thoughts: No   Sensorium  Memory: Immediate Fair; Remote Good; Recent Good  Judgment: Fair  Insight: Fair   Art therapist  Concentration: Good  Attention Span: Good  Recall: Good  Fund of Knowledge: Fair  Language: Good   Psychomotor Activity  Psychomotor Activity: Psychomotor Activity: Normal   Assets  Assets: Communication Skills; Desire for Improvement; Vocational/Educational; Transportation; Housing; Resilience   Sleep  Sleep: Sleep: Poor  Estimated Sleeping Duration (Last 24 Hours): 9.75-11.25 hours  Nutritional Assessment (For OBS and FBC admissions only) Has the patient had a weight loss or gain of 10 pounds or more in the last 3 months?: No Has the patient had a decrease in food intake/or appetite?: No Does the patient have dental problems?: No Does the patient have eating habits or behaviors that may be indicators of an eating disorder including binging or inducing  vomiting?: No Has the patient recently lost weight without trying?: 0 Has the patient been eating poorly because of a decreased appetite?: 0 Malnutrition Screening Tool Score: 0    Physical Exam  Physical Exam Vitals and nursing note reviewed.  HENT:     Head: Normocephalic.  Pulmonary:     Effort: Pulmonary effort is normal.  Skin:    General: Skin is dry.  Neurological:     Mental Status: He is alert and oriented to person, place, and time.  Psychiatric:        Attention and Perception: Attention normal.  He perceives auditory hallucinations.        Mood and Affect: Mood is anxious and depressed. Affect is blunt.        Speech: Speech is delayed and slurred.        Behavior: Behavior is slowed, withdrawn and actively hallucinating. Behavior is cooperative.        Thought Content: Thought content is paranoid. Thought content is not delusional. Thought content does not include homicidal or suicidal ideation.        Cognition and Memory: Cognition and memory normal.        Judgment: Judgment normal.    Review of Systems  Constitutional: Negative.   Psychiatric/Behavioral:  Positive for depression, hallucinations and substance abuse. Negative for memory loss and suicidal ideas. The patient is nervous/anxious and has insomnia.    Blood pressure (!) 138/92, pulse (!) 107, temperature 98.5 F (36.9 C), temperature source Oral, resp. rate 19, SpO2 100%. There is no height or weight on file to calculate BMI.  Demographic Factors:  Male, Divorced or widowed, and Low socioeconomic status  Loss Factors: Loss of significant relationship  Historical Factors: Impulsivity  Risk Reduction Factors:   Responsible for children under 3 years of age, Employed, Living with another person, especially a relative, Positive social support, and Positive coping skills or problem solving skills  Continued Clinical Symptoms:  Alcohol/Substance Abuse/Dependencies Currently Psychotic  Cognitive  Features That Contribute To Risk:  None    Suicide Risk:  Mild:  There are no identifiable suicide plans, no associated intent, mild dysphoria and related symptoms, good self-control (both objective and subjective assessment), few other risk factors, and identifiable protective factors, including available and accessible social support.   Plan Of Care/Follow-up recommendations:    [facility] MDD with psychotic features, alcohol use disorder, cannabis use disorder. Consider detailed workup for first break psychosis. Patient has not had any brain imaging, might consider medical workup as well.   Lynwood Morene Lavone Delsie, MD 02/12/2024, 1:33 PM

## 2024-02-12 NOTE — Discharge Instructions (Addendum)
To [facility]

## 2024-02-12 NOTE — Group Note (Signed)
 Date:  02/12/2024 Time:  4:16 PM  Group Topic/Focus:  Medication Side effect    Participation Level:  Did Not Attend   Almarie MALVA Lowers 02/12/2024, 4:16 PM

## 2024-02-12 NOTE — Tx Team (Signed)
 Initial Treatment Plan 02/12/2024 5:25 PM [name] FMW:[id number]    PATIENT STRESSORS: Other: bills work     PATIENT STRENGTHS: Other: hard worker, strong support   PATIENT IDENTIFIED PROBLEMS: In my mind someone outside really wants to do me harm reports paranoia and AH                     DISCHARGE CRITERIA:    PRELIMINARY DISCHARGE PLAN:   PATIENT/FAMILY INVOLVEMENT: This treatment plan has been presented to and reviewed with the patient, Mark Dodson, and/or family member.  The patient and family have been given the opportunity to ask questions and make suggestions.  Prentice JINNY Angle, RN 02/12/2024, 5:25 PM

## 2024-02-12 NOTE — ED Notes (Signed)
 Pt sleeping at this time. Rise and fall of chest noted. Pt in NAD at this time. Will continue to monitor.

## 2024-02-12 NOTE — Progress Notes (Signed)
 Pt has been accepted to [facility] on 02/12/2024 Bed assignment: 403-01  Pt meets inpatient criteria per: Thurman Ivans NP  Attending Physician will be: Dr. Prentis    Report can be called to: Adult unit: [phone]  Pt can arrive after 2 PM   Care Team Notified: [facility]  [facility] RN, Lynwood Bash MD, Damien Fireman RN  [place] Tiffinie Caillier LCSW-A   02/12/2024 1:24 PM

## 2024-02-12 NOTE — Progress Notes (Signed)
 Prentice Mark Dodson Angle, RN, 02/12/24, Time of arrival: 1615  Patient is a new admit to unit. Patient is voluntary. Patient belongings addressed and stored. Skin check performed with two staff, results unremarkable. Vital signs unremarkable. No reported or observed physiological concerns or abnormalities. Patient engaged with assessment with encouragement. Patient orientated to facility, unit and room. All questions and concerns addressed at this time.  Patient stated reason for admission: in my mind someone outside really wants to do me harm Reports increasing paranoia and AH last 1.5-2 months  Patient presentation remarkable for: Calm, congruent, but guarded  Patient history remarkable for: No prior hx of AH or paranoia. No reported family hx of psychiatric issues. Denies any recent exposure to environmental contaminates at home or work. Patient reports daily ETOH use 2-3 beers 4 shots of [place]

## 2024-02-12 NOTE — ED Notes (Signed)
 Patient currently resting in recliner. RR even and unlabored, appearing in no noted distress. Environmental check complete

## 2024-02-12 NOTE — ED Notes (Signed)
 Patient currently sleeping and resting in recliner. RR even and unlabored, appearing in no noted distress. Environmental check complete

## 2024-02-12 NOTE — ED Notes (Signed)
 Pt A&Ox4, calm & cooperative and in NAD at this time. Denies SI/HI/VH. Endorses AH. Contracts for safety. Encouragement and support given. Will continue to monitor.

## 2024-02-12 NOTE — Plan of Care (Signed)
   Problem: Education: Goal: Knowledge of [facility] Education information/materials will improve Outcome: Progressing Goal: Verbalization of understanding the information provided will improve Outcome: Progressing

## 2024-02-12 NOTE — [facility] Group Notes (Signed)
 Adult Psychoeducational Group Note  Date:  02/12/2024 Time:  8:58 PM  Group Topic/Focus:  Wrap-Up Group:   The focus of this group is to help patients review their daily goal of treatment and discuss progress on daily workbooks.  Participation Level:  Active  Participation Quality:  Appropriate  Affect:  Appropriate  Cognitive:  Appropriate  Insight: Appropriate  Engagement in Group:  Engaged  Modes of Intervention:  Discussion  Additional Comments:  His day was a 7. His goal for today make a steph closer to get discharge.Coping skills helpful talking to someone. He love himself. Suprisine he is a caring person  Lang Donia Law 02/12/2024, 8:58 PM

## 2024-02-13 DIAGNOSIS — F1020 Alcohol dependence, uncomplicated: Secondary | ICD-10-CM | POA: Insufficient documentation

## 2024-02-13 DIAGNOSIS — F1290 Cannabis use, unspecified, uncomplicated: Secondary | ICD-10-CM | POA: Insufficient documentation

## 2024-02-13 DIAGNOSIS — F10939 Alcohol use, unspecified with withdrawal, unspecified: Secondary | ICD-10-CM

## 2024-02-13 DIAGNOSIS — F323 Major depressive disorder, single episode, severe with psychotic features: Secondary | ICD-10-CM

## 2024-02-13 DIAGNOSIS — Z720 Tobacco use: Secondary | ICD-10-CM | POA: Insufficient documentation

## 2024-02-13 DIAGNOSIS — F29 Unspecified psychosis not due to a substance or known physiological condition: Secondary | ICD-10-CM

## 2024-02-13 MED ORDER — NICOTINE 14 MG/24HR TD PT24: 14.0000 mg | MEDICATED_PATCH | Freq: Every day | TRANSDERMAL | Status: DC | PRN

## 2024-02-13 MED ORDER — HYDROXYZINE HCL 25 MG PO TABS: 25.0000 mg | ORAL_TABLET | Freq: Three times a day (TID) | ORAL | Status: DC | PRN

## 2024-02-13 MED ORDER — RISPERIDONE 1 MG PO TABS
1.0000 mg | ORAL_TABLET | Freq: Every day | ORAL | Status: DC
  Administered 2024-02-13: 1 mg via ORAL
  Filled 2024-02-13: qty 1

## 2024-02-13 NOTE — Plan of Care (Signed)
  Problem: Activity: Goal: Interest or engagement in activities will improve Outcome: Progressing Goal: Sleeping patterns will improve Outcome: Progressing   Problem: Coping: Goal: Ability to verbalize frustrations and anger appropriately will improve Outcome: Progressing Goal: Ability to demonstrate self-control will improve Outcome: Progressing   Problem: Safety: Goal: Periods of time without injury will increase Outcome: Progressing

## 2024-02-13 NOTE — [facility] Suicide Risk Assessment (Signed)
 [facility] INPATIENT:  Family/Significant Other Suicide Prevention Education  Suicide Prevention Education:  Education Completed; Mark Dodson (wife) [phone], has been identified by the patient as the family member/significant other with whom the patient will be residing, and identified as the person(s) who will aid the patient in the event of a mental health crisis (suicidal ideations/suicide attempt).  With written consent from the patient, the family member/significant other has been provided the following suicide prevention education, prior to the and/or following the discharge of the patient.  The suicide prevention education provided includes the following: Suicide risk factors Suicide prevention and interventions #988 National Suicide Hotline telephone number [facility] assessment telephone number [facility] Emergency Assistance [address] [facility] and/or Residential Mobile Crisis Unit telephone number  Request made of family/significant other to: Remove weapons (e.g., guns, rifles, knives), all items previously/currently identified as safety concern.   Remove drugs/medications (over-the-counter, prescriptions, illicit drugs), all items previously/currently identified as a safety concern. *[name] states no firearms in the home.   The family member verbalizes understanding of the suicide prevention education information provided.  The family member/significant other agrees to remove the items of safety concern listed above.  Mark Dodson Lever 02/13/2024, 6:24 PM

## 2024-02-13 NOTE — [facility] Suicide Risk Assessment (Signed)
 Suicide Risk Assessment  Admission Assessment    [facility] Admission Suicide Risk Assessment   Nursing information obtained from:  Patient Demographic factors:  Male Current Mental Status:  NA Loss Factors:  NA Historical Factors:  NA Risk Reduction Factors:  Responsible for children under 42 years of age, Sense of responsibility to family, Employed, Positive social support, Living with another person, especially a relative  Total Time spent with patient: 30 minutes Principal Problem: Psychosis (HCC) Diagnosis:  Principal Problem:   Psychosis, unspecified type Active Problems:   Alcohol use disorder, severe, dependence (HCC)   Alcohol withdrawal (HCC)   Nicotine  use   Cannabis use  Subjective Data:  Mark Dodson is a 43 y.o. male  with no formal past psychiatric history. patient initially arrived to [facility] on 02/11/2024 for possible AVH and paranoia, and admitted to [facility] on 02/12/2024 for impaired functioning, substance related issues, and intensive therapeutic interventions.    HPI:  Patient reports that for the last month and a half he has been hearing people outside his window that are trying to get him.  He has reported this concern to his family members to say that there is no one who he is seeing or hearing.  Family tells him you are tripping and you need to see a psychiatrist.  Per chart review patient had similar episode 1 year ago in which he presented to ED and was recommended for inpatient but did not want to go and could not be IVC given mild symptoms.  Patient reports that they normally occur after he wakes up and when he is at work but occur other times as well.  He says that they are worse when he is at certain places where he does not feel safe.  Reports that he feels like someone is following him and says that he has caught a glimpse of this person once but does not know anybody who he has wronged or anybody that would be out to get him.  He reports no personal  history of psychosis and denies any family history of psychosis.    Regarding substances, he reports that he uses alcohol, nicotine , and cannabis.  Says that he has not used cannabis in over 3 months and he will use a small amount when he does saying I get 3 g and share with people when they come over and that 3 g lasts him a while.  He denies daily use of cannabis when he was using.  Denies using in last 3 months but UDS positive.  Does report that he gets more paranoid when he does use cannabis.  Regarding alcohol, he reports that he drinks daily and has 3-4 shots and two 16 oz beers daily.  He reports that he has tremors when he wakes up and often drinks in the morning.  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  He denies any history of severe withdrawal and denies formally going through detox in the past.  He denies any history of seizures. No hx of rehab. He does somewhat endorse hallucinations that occur when he is withdrawing from alcohol but does say that he sometimes has these AVH/paranoia when he is drinking.  He also reports smoking a pack of cigarettes over 2 days.  He denies any other substance use including cocaine.    Regarding alcohol withdrawal symptoms patient endorses having some tremors but since being in the hospital has  not had significant symptoms.  Currently denies nausea, vomiting, tremors, diaphoresis, diarrhea, perceptual disturbances including AVH.  Reports no excessive sedation on the Librium  taper.   On other medical review of systems, patient denies any issues with memory loss or issues with iADLs.  Denies any issues with confusion, unbalanced gait.  Reports he has no past medical issues but also has not seen a doctor in 10 years.no fam hx of dementia.    On psychiatric review of systems, patient does not endorse depressed mood, anhedonia, suicidal thoughts.  He endorses being anxious but usually with regard  to his financial and work stresses.  Several days a week he endorses feeling anxious, unable to stop worrying, worrying too much, trouble relaxing, restlessness, irritability, and feeling afraid that something will happen.  He does not describe panic attacks, obsessions, compulsions, social anxiety, phobias.  He denies any history of traumatic experiences.  He reports that he sleeps from 7 AM to 3 PM daily and feels relatively well rested.  Reports that he has normal appetite.  Denies any manic-like episodes in the past.  Continued Clinical Symptoms:    The Alcohol Use Disorders Identification Test, Guidelines for Use in Primary Care, Second Edition.  World Science writer [facility]). Score between 0-7:  no or low risk or alcohol related problems. Score between 8-15:  moderate risk of alcohol related problems. Score between 16-19:  high risk of alcohol related problems. Score 20 or above:  warrants further diagnostic evaluation for alcohol dependence and treatment.   CLINICAL FACTORS:  Possible AVH and paranoia. No other symptoms.    Musculoskeletal: Strength & Muscle Tone: within normal limits Gait & Station: normal Patient leans: N/A  Psychiatric Specialty Exam:  Presentation  General Appearance:  Casual  Eye Contact: Good  Speech: Normal Rate  Speech Volume: Normal  Handedness: Right   Mood and Affect  Mood: Euthymic  Affect: Congruent   Thought Process  Thought Processes: Coherent; Linear  Descriptions of Associations:Intact  Orientation:Full (Time, Place and Person)  Thought Content:Logical (Paranoid ideations that someone is after him that he believes are very real)  History of Schizophrenia/Schizoaffective disorder:No  Duration of Psychotic Symptoms:N/A  Hallucinations:Hallucinations: None (Denies currently.) Description of Auditory Hallucinations: n/a Description of Visual Hallucinations: n/a  Ideas of Reference:None  Suicidal  Thoughts:Suicidal Thoughts: No  Homicidal Thoughts:Homicidal Thoughts: No   Sensorium  Memory: Immediate Good; Recent Good; Remote Good  Judgment: Fair  Insight: Fair   Art therapist  Concentration: Good  Attention Span: Good  Recall: Good  Fund of Knowledge: Good  Language: Good   Psychomotor Activity  Psychomotor Activity: Psychomotor Activity: Normal   Assets  Assets: Desire for Improvement; Housing; Social Support; Vocational/Educational; Transportation   Sleep  Sleep: Sleep: Fair (8 hours daily. no daytime sleepiness. slept okay overnight)    Physical Exam: Vitals and nursing note reviewed.  HENT:     Head: Normocephalic and atraumatic.  Pulmonary:     Effort: Pulmonary effort is normal.  Neurological:     General: No focal deficit present.     Mental Status: He is alert.     Comments: 5/5 strength bilaterally throughout. Normal sensation and symmetric throughout. Cranial nerves intact. Coordination intact. Gait and station are normal.   Psychiatric:     Comments: No obvious EPS.     Review of Systems  Constitutional:  Negative for diaphoresis and fever.  Cardiovascular:  Negative for chest pain and palpitations.  Gastrointestinal:  Negative for constipation, diarrhea, nausea and vomiting.  Neurological:  Negative for dizziness, tremors, weakness and headaches.  Blood pressure 133/87, pulse 71, temperature 98 F (36.7 C), temperature source Oral, resp. rate 16, height 6' 1 (1.854 m), weight 59.9 kg, SpO2 100%. Body mass index is 17.42 kg/m.   COGNITIVE FEATURES THAT CONTRIBUTE TO RISK:  None    SUICIDE RISK:   Minimal: No identifiable suicidal ideation.  Patients presenting with no risk factors but with morbid ruminations; may be classified as minimal risk based on the severity of the depressive symptoms  PLAN OF CARE: See H&P  I certify that inpatient services furnished can reasonably be expected to improve the patient's  condition.   [name], MD 02/13/2024, 1:30 PM

## 2024-02-13 NOTE — Group Note (Signed)
 Date:  02/13/2024 Time:  4:28 PM  Group Topic/Focus:  Managing Feelings:   The focus of this group is to identify what feelings patients have difficulty handling and develop a plan to handle them in a healthier way upon discharge.    Participation Level:  Active  Participation Quality:  Appropriate and Attentive  Affect:  Appropriate  Cognitive:  Appropriate  Insight: Good  Engagement in Group:  Engaged  Modes of Intervention:  Activity, Discussion, and Education   [employer] 02/13/2024, 4:28 PM

## 2024-02-13 NOTE — Progress Notes (Signed)
(  Sleep Hours) - 4.5 (Any PRNs that were needed, meds refused, or side effects to meds)- Trazadone (Any disturbances and when (visitation, over night)- None (Concerns raised by the patient)- None (SI/HI/AVH)- AH

## 2024-02-13 NOTE — [facility] Group Notes (Signed)
 [facility] Group Notes:  (Nursing/MHT/Case Management/Adjunct)  Date:  02/13/2024  Time:  10:18 PM  Type of Therapy:  Wrap-up group  Participation Level:  Active  Participation Quality:  Appropriate  Affect:  Appropriate  Cognitive:  Appropriate  Insight:  Appropriate  Engagement in Group:  Engaged  Modes of Intervention:  Education  Summary of Progress/Problems: Goal to be D/C. Rated day 7/10.   Mark Dodson 02/13/2024, 10:18 PM

## 2024-02-13 NOTE — H&P (Addendum)
 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 male  with no formal past psychiatric history. Mark Dodson initially arrived to [facility] on 02/11/2024 for possible AVH and paranoia, and admitted to [facility] on 02/12/2024 for impaired functioning, substance related issues, and intensive therapeutic interventions.   HPI:  Mark Dodson reports that for the last month and a half Mark Dodson has been hearing people outside his window that are trying to get him.  Mark Dodson has reported this concern to his family members to say that there is no one who Mark Dodson is seeing or hearing.  Family tells him you are tripping and you need to see a psychiatrist.  Per chart review Mark Dodson had similar episode 1 year ago in which Mark Dodson presented to ED and was recommended for inpatient but did not want to go and could not be IVC given mild symptoms.  Mark Dodson reports that they normally occur after Mark Dodson wakes up and when Mark Dodson is at work but occur other times as well.  Mark Dodson says that they are worse when Mark Dodson is at certain places where Mark Dodson does not feel safe.  Reports that Mark Dodson feels like someone is following him and says that Mark Dodson has caught a glimpse of this person once but does not know anybody who Mark Dodson has wronged or anybody that would be out to get him.  Mark Dodson reports no personal history of psychosis and denies any family history of psychosis.   Regarding substances, Mark Dodson reports that Mark Dodson uses alcohol, nicotine , and cannabis.  Says that Mark Dodson has not used  cannabis in over 3 months and Mark Dodson will use a small amount when Mark Dodson does saying I get 3 g and share with people when they come over and that 3 g lasts him a while.  Mark Dodson denies daily use of cannabis when Mark Dodson was using.  Denies using in last 3 months but UDS positive.  Does report that Mark Dodson gets more paranoid when Mark Dodson does use cannabis.  Regarding alcohol, Mark Dodson reports that Mark Dodson drinks daily and has 3-4 shots and two 16 oz beers daily.  Mark Dodson reports that Mark Dodson has tremors when Mark Dodson wakes up and often drinks in the morning.  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  Mark Dodson denies any history of severe withdrawal and denies formally going through detox in the past.  Mark Dodson denies any history of seizures. No hx of rehab. Mark Dodson does somewhat endorse hallucinations that occur when Mark Dodson is withdrawing from alcohol but does say that Mark Dodson sometimes has these AVH/paranoia when Mark Dodson is drinking.  Mark Dodson also reports smoking a pack of cigarettes over 2 days.  Mark Dodson denies any other substance use including cocaine.   Regarding alcohol withdrawal symptoms Mark Dodson endorses having some tremors but since being in the hospital has not had significant symptoms.  Currently denies nausea, vomiting, tremors, diaphoresis, diarrhea, perceptual disturbances including AVH.  Reports no excessive sedation on the Librium  taper.  On other medical review of systems,  Mark Dodson denies any issues with memory loss or issues with iADLs.  Denies any issues with confusion, unbalanced gait.  Reports Mark Dodson has no past medical issues but also has not seen a doctor in 10 years.no fam hx of dementia.   On psychiatric review of systems, Mark Dodson does not endorse depressed mood, anhedonia, suicidal thoughts.  Mark Dodson endorses being anxious but usually with regard to his financial and work stresses.  Several days a week Mark Dodson endorses feeling anxious, unable to stop worrying, worrying too much, trouble relaxing, restlessness, irritability,  and feeling afraid that something will happen.  Mark Dodson does not describe panic attacks, obsessions, compulsions, social anxiety, phobias.  Mark Dodson denies any history of traumatic experiences.  Mark Dodson reports that Mark Dodson sleeps from 7 AM to 3 PM daily and feels relatively well rested.  Reports that Mark Dodson has normal appetite.  Denies any manic-like episodes in the past.  Collateral Information: Mark Dodson declines.    Past Psychiatric Hx: Previous Psych Diagnoses: Mark Dodson reports no psychiatric history, 1 year ago there was a emergency department visit in which Mark Dodson had similar AVH and paranoia but was discharged without treatment or documented psychiatric assessment. Prior inpatient treatment: Denies Current/prior outpatient treatment: Denies Prior rehab hx: Denies Psychotherapy hx: Denies History of suicide: Denies History of homicide or aggression: Denies Psychiatric medication history: Denies, none documented Neuromodulation history: Denies Current Psychiatrist: Never Current therapist: Never   Substance Abuse Hx: Alcohol: every day alcohol user. 2 beers plus 3-4 number of shots of unspecified quantity. Contemplative state of change.  Tobacco: Pack over 2 to 3 days Illicit drugs: THC only. Last reported use was 3 months ago (prior to paranoia worsening) However Mark Dodson UDS positive for THC. Rx drug abuse: denies Rehab hx: no   Past Medical History: Medical Diagnoses: History of Bell's palsy Home Rx: None Prior Hosp: Hallucinations, Bell's palsy Prior Surgeries/Trauma: Denies Head trauma, LOC, concussions, seizures: Denies Allergies: No allergies   Family History: Medical: Thyroid  cancer in his mother, CKD in father Psych: None Psych Rx: Denies SA/HA: Denies Substance use family hx: Alcohol use in both parents, THC use and family   Social History: Childhood (bring, raised, lives now, parents, siblings, schooling, education): Born and raised in [place], has lived in [place] over the last 6  or 7 years. Abuse: Denies Marital Status: Married for the last 4 years to [employer]  Currently separated. Sexual orientation: Prefers women Children: 1 daughter turning 7 on October 1 Employment: Works as a Animal nutritionist at [employer]: Good friends with brother-in-law Housing: Currently living with brother-in-law Finances: No current strain Hotel manager: No Secretary/administrator: denies any upcoming court dates.  Access to firearms: Mark Dodson denies    [place] Scale:  Flowsheet Row [facility] (Current[facility]11[facility]  C-SSRS RISK CATEGORY No Risk No Risk     Tobacco Screening:  Social History   Tobacco Use  Smoking Status Every Day   Current packs/day: 0.25   Types: Cigarettes  Smokeless Tobacco Never    BH Tobacco Counseling     Are you interested in Tobacco Cessation Medications?  No, Mark Dodson refused Counseled Mark Dodson on smoking cessation:  Refused/Declined practical counseling Reason Tobacco Screening Not Completed: Mark Dodson Refused Screening       Social History:  Social History   Substance and Sexual Activity  Alcohol Use Yes   Alcohol/week: 56.0 standard drinks of alcohol   Types: 28  Cans of beer, 28 Shots of liquor per week     Social History   Substance and Sexual Activity  Drug Use Not Currently     Allergies:  No Known Allergies Lab Results:  Results for orders placed or performed during the hospital encounter of 02/11/24 (from the past 48 hours)  CBC with Differential/Platelet     Status: Abnormal   Collection Time: 02/11/24  9:50 PM  Result Value Ref Range   WBC 4.9 4.0 - 10.5 K/uL   RBC 4.06 (L) 4.22 - 5.81 MIL/uL   Hemoglobin 13.3 13.0 - 17.0 g/dL   HCT 63.3 (L) 60.9 - 47.9 %   MCV 90.1 80.0 - 100.0 fL   MCH 32.8 26.0 - 34.0 pg   MCHC 36.3 (H) 30.0 - 36.0 g/dL   RDW 86.9 88.4 - 84.4 %   Platelets 123  (L) 150 - 400 K/uL   nRBC 0.0 0.0 - 0.2 %   Neutrophils Relative % 69 %   Neutro Abs 3.4 1.7 - 7.7 K/uL   Lymphocytes Relative 20 %   Lymphs Abs 1.0 0.7 - 4.0 K/uL   Monocytes Relative 10 %   Monocytes Absolute 0.5 0.1 - 1.0 K/uL   Eosinophils Relative 0 %   Eosinophils Absolute 0.0 0.0 - 0.5 K/uL   Basophils Relative 1 %   Basophils Absolute 0.0 0.0 - 0.1 K/uL   Immature Granulocytes 0 %   Abs Immature Granulocytes 0.02 0.00 - 0.07 K/uL    Comment: Performed at [facility], [address] [place], [place] [address]  Comprehensive metabolic panel     Status: Abnormal   Collection Time: 02/11/24  9:50 PM  Result Value Ref Range   Sodium 140 135 - 145 mmol/L   Potassium 3.8 3.5 - 5.1 mmol/L   Chloride 100 98 - 111 mmol/L   CO2 24 22 - 32 mmol/L   Glucose, Bld 98 70 - 99 mg/dL    Comment: Glucose reference range applies only to samples taken after fasting for at least 8 hours.   BUN 5 (L) 6 - 20 mg/dL   Creatinine, Ser 9.35 0.61 - 1.24 mg/dL   Calcium 9.6 8.9 - 89.6 mg/dL   Total Protein 7.2 6.5 - 8.1 g/dL   Albumin 4.1 3.5 - 5.0 g/dL   AST 850 (H) 15 - 41 U/L   ALT 95 (H) 0 - 44 U/L   Alkaline Phosphatase 71 38 - 126 U/L   Total Bilirubin 1.0 0.0 - 1.2 mg/dL   GFR, Estimated >39 >39 mL/min    Comment: (NOTE) Calculated using the CKD-EPI Creatinine Equation (2021)    Anion gap 16 (H) 5 - 15    Comment: Performed at [facility], [address] [place], [place] [address]  Hemoglobin A1c     Status: Abnormal   Collection Time: 02/11/24  9:50 PM  Result Value Ref Range   Hgb A1c MFr Bld 5.9 (H) 4.8 - 5.6 %    Comment: (NOTE) Diagnosis of Diabetes The following HbA1c ranges recommended by the American Diabetes Association (ADA) may be used as an aid in the diagnosis of diabetes mellitus.  Hemoglobin             Suggested A1C NGSP%              Diagnosis  <5.7                   Non Diabetic  5.7-6.4  Pre-Diabetic  >6.4                    Diabetic  <7.0                   Glycemic control for                       adults with diabetes.     Mean Plasma Glucose 122.63 mg/dL    Comment: Performed at [facility], [address] [place], [place] [address]  Lipid panel     Status: Abnormal   Collection Time: 02/11/24  9:50 PM  Result Value Ref Range   Cholesterol 213 (H) 0 - 200 mg/dL   Triglycerides 68 <849 mg/dL   HDL 877 >59 mg/dL   Total CHOL/HDL Ratio 1.7 RATIO   VLDL 14 0 - 40 mg/dL   LDL Cholesterol 77 0 - 99 mg/dL    Comment:        Total Cholesterol/HDL:CHD Risk Coronary Heart Disease Risk Table                     Men   Women  1/2 Average Risk   3.4   3.3  Average Risk       5.0   4.4  2 X Average Risk   9.6   7.1  3 X Average Risk  23.4   11.0        Use the calculated Mark Dodson Ratio above and the CHD Risk Table to determine the Mark Dodson's CHD Risk.        ATP III CLASSIFICATION (LDL):  <100     mg/dL   Optimal  899-870  mg/dL   Near or Above                    Optimal  130-159  mg/dL   Borderline  839-810  mg/dL   High  >809     mg/dL   Very High Performed at [facility], [address], [place], [place] [address]   Ethanol     Status: Abnormal   Collection Time: 02/11/24  9:50 PM  Result Value Ref Range   Alcohol, Ethyl (B) 177 (H) <15 mg/dL    Comment: (NOTE) For medical purposes only. Performed at [facility], [address], [place], [place] [address]   TSH     Status: None   Collection Time: 02/11/24  9:50 PM  Result Value Ref Range   TSH 0.454 0.350 - 4.500 uIU/mL    Comment: Performed by a 3rd Generation assay with a functional sensitivity of <=0.01 uIU/mL. Performed at [facility], [address] [place], [place] [address]   POCT Urine Drug Screen - (I-Screen)     Status: Abnormal   Collection Time: 02/11/24 10:28 PM  Result Value Ref Range   POC Amphetamine UR None Detected NONE DETECTED (Cut Off Level 1000 ng/mL)   POC Secobarbital (BAR) None  Detected NONE DETECTED (Cut Off Level 300 ng/mL)   POC Buprenorphine (BUP) None Detected NONE DETECTED (Cut Off Level 10 ng/mL)   POC Oxazepam (BZO) None Detected NONE DETECTED (Cut Off Level 300 ng/mL)   POC Cocaine UR None Detected NONE DETECTED (Cut Off Level 300 ng/mL)   POC Methamphetamine UR None Detected NONE DETECTED (Cut Off Level 1000 ng/mL)   POC Morphine None Detected NONE DETECTED (Cut Off Level 300 ng/mL)  POC Methadone UR None Detected NONE DETECTED (Cut Off Level 300 ng/mL)   POC Oxycodone UR None Detected NONE DETECTED (Cut Off Level 100 ng/mL)   POC Marijuana UR Positive (A) NONE DETECTED (Cut Off Level 50 ng/mL)    Blood Alcohol level:  Lab Results  Component Value Date   ETH 177 (H) 02/11/2024    Metabolic Disorder Labs:  Lab Results  Component Value Date   HGBA1C 5.9 (H) 02/11/2024   MPG 122.63 02/11/2024   No results found for: PROLACTIN Lab Results  Component Value Date   CHOL 213 (H) 02/11/2024   TRIG 68 02/11/2024   HDL 122 02/11/2024   CHOLHDL 1.7 02/11/2024   VLDL 14 02/11/2024   LDLCALC 77 02/11/2024    Current Medications: Current Facility-Administered Medications  Medication Dose Route Frequency Provider Last Rate Last Admin   acetaminophen  (TYLENOL ) tablet 650 mg  650 mg Oral Q6H PRN Wise, James Benjamin Stallworth, MD       alum & mag hydroxide-simeth (MAALOX/MYLANTA) 200-200-20 MG/5ML suspension 30 mL  30 mL Oral Q4H PRN Delsie Lynwood Morene Lavone, MD       chlordiazePOXIDE  (LIBRIUM ) capsule 25 mg  25 mg Oral Q6H PRN Delsie Lynwood Morene Lavone, MD       chlordiazePOXIDE  (LIBRIUM ) capsule 25 mg  25 mg Oral TID Delsie Lynwood Morene Lavone, MD   25 mg at 02/13/24 1200   Followed by   NOREEN ON 02/14/2024] chlordiazePOXIDE  (LIBRIUM ) capsule 25 mg  25 mg Oral BH-qamhs Delsie Lynwood Morene Lavone, MD       Followed by   NOREEN ON 02/15/2024] chlordiazePOXIDE  (LIBRIUM ) capsule 25 mg  25 mg Oral Daily Delsie Lynwood Morene Lavone, MD       haloperidol  (HALDOL ) tablet 5 mg  5 mg Oral TID PRN Delsie Lynwood Morene Lavone, MD       And   diphenhydrAMINE  (BENADRYL ) capsule 50 mg  50 mg Oral TID PRN Delsie Lynwood Morene Lavone, MD       haloperidol  lactate (HALDOL ) injection 5 mg  5 mg Intramuscular TID PRN Delsie Lynwood Morene Lavone, MD       And   diphenhydrAMINE  (BENADRYL ) injection 50 mg  50 mg Intramuscular TID PRN Delsie Lynwood Morene Lavone, MD       And   LORazepam  (ATIVAN ) injection 2 mg  2 mg Intramuscular TID PRN Delsie Lynwood Morene Lavone, MD       haloperidol  lactate (HALDOL ) injection 10 mg  10 mg Intramuscular TID PRN Delsie Lynwood Morene Lavone, MD       And   diphenhydrAMINE  (BENADRYL ) injection 50 mg  50 mg Intramuscular TID PRN Delsie Lynwood Morene Lavone, MD       And   LORazepam  (ATIVAN ) injection 2 mg  2 mg Intramuscular TID PRN Delsie Lynwood Morene Lavone, MD       hydrOXYzine  (ATARAX ) tablet 25 mg  25 mg Oral TID PRN Laketa Sandoz, MD       loperamide  (IMODIUM ) capsule 2-4 mg  2-4 mg Oral PRN Delsie Lynwood Morene Lavone, MD       magnesium  hydroxide (MILK OF MAGNESIA) suspension 30 mL  30 mL Oral Daily PRN Delsie Lynwood Morene Lavone, MD       multivitamin with minerals tablet 1 tablet  1 tablet Oral Daily Delsie Lynwood Morene Lavone, MD   1 tablet at 02/13/24 9191   nicotine  (NICODERM CQ  - dosed in mg/24 hours) patch 14 mg  14 mg Transdermal Daily PRN  Aking Klabunde, MD       nicotine  polacrilex (NICORETTE ) gum 2 mg  2 mg Oral PRN Delsie Lynwood Morene Lavone, MD   2 mg at 02/13/24 1323   ondansetron  (ZOFRAN -ODT) disintegrating tablet 4 mg  4 mg Oral Q6H PRN Delsie Lynwood Morene Lavone, MD       risperiDONE  (RISPERDAL ) tablet 1 mg  1 mg Oral QHS Bridgid Printz, MD       traZODone  (DESYREL ) tablet 50 mg  50 mg Oral QHS PRN Delsie Lynwood Morene Lavone, MD   50 mg at 02/12/24 2116   PTA Medications: No medications prior to admission.     Musculoskeletal: Strength & Muscle Tone: within normal limits Gait & Station: normal Mark Dodson leans: N/A  Psychiatric Specialty Exam:  Presentation  General Appearance:  Casual  Eye Contact: Good  Speech: Normal Rate  Speech Volume: Normal  Handedness: Right   Mood and Affect  Mood: Euthymic  Affect: Congruent   Thought Process  Thought Processes: Coherent; Linear  Descriptions of Associations: Intact  Orientation: Full (Time, Place and Person)  Thought Content: Logical (Paranoid ideations that someone is after him that Mark Dodson believes are very real)  History of Schizophrenia/Schizoaffective disorder: No  Duration of Psychotic Symptoms:N/A Hallucinations: Hallucinations: None (Denies currently.) Description of Auditory Hallucinations: n/a Description of Visual Hallucinations: n/a  Ideas of Reference: None  Suicidal Thoughts: Suicidal Thoughts: No  Homicidal Thoughts: Homicidal Thoughts: No   Sensorium  Memory: Immediate Good; Recent Good; Remote Good  Judgment: Fair  Insight: Fair   Art therapist  Concentration: Good  Attention Span: Good  Recall: Good  Fund of Knowledge: Good  Language: Good   Psychomotor Activity  Psychomotor Activity: Psychomotor Activity: Normal   Assets  Assets: Desire for Improvement; Housing; Social Support; Vocational/Educational; Transportation   Sleep  Sleep: Sleep: Fair (8 hours daily. no daytime sleepiness. slept okay overnight)    Physical Exam: Physical Exam Vitals and nursing note reviewed.  HENT:     Head: Normocephalic and atraumatic.  Pulmonary:     Effort: Pulmonary effort is normal.  Neurological:     General: No focal deficit present.     Mental Status: Mark Dodson is alert.     Comments: 5/5 strength bilaterally throughout. Normal sensation and symmetric throughout. Cranial nerves intact. Coordination intact. Gait and station are normal.   Psychiatric:      Comments: No obvious EPS.    Review of Systems  Constitutional:  Negative for diaphoresis and fever.  Cardiovascular:  Negative for chest pain and palpitations.  Gastrointestinal:  Negative for constipation, diarrhea, nausea and vomiting.  Neurological:  Negative for dizziness, tremors, weakness and headaches.   Blood pressure 133/87, pulse 71, temperature 98 F (36.7 C), temperature source Oral, resp. rate 16, height 6' 1 (1.854 m), weight 59.9 kg, SpO2 100%. Body mass index is 17.42 kg/m.   Treatment Plan Summary: Daily contact with Mark Dodson to assess and evaluate symptoms and progress in treatment and Medication management   ASSESSMENT & PLAN  ASSESSMENT:   Diagnoses / Active Problems:  Principal Problem:   Psychosis, unspecified type Active Problems:   Alcohol use disorder, severe, dependence (HCC)   Alcohol withdrawal (HCC)   Nicotine  use   Cannabis use    Mark Dodson is a 43 year old male with no past psychiatric history except for similar presentation to ED 1 year ago for which Mark Dodson was discharged.  Mark Dodson currently presents with perceptions that someone is following him and out to get him and hearing  this person outside and getting glimpse of them, but family reports that there is no one there.  Mark Dodson has no personal history of psychosis, no family history of psychosis, and is not currently presenting with symptoms consistent with primary psychotic disorder (e.g. negative symptoms, disorganization, etc).  Mark Dodson has no trauma history.  Mark Dodson is not having major depressive episode. Mild anxiety symptoms that are within range of normal. Our best bernadette is that Mark Dodson may be experiencing substance-induced psychosis from alcohol, maybe a alcohol hallucination or outright substance-induced.  Mark Dodson denies recent cannabis use but has positive UDS which undoubtably would have been negative (unless false positive but supsect not) if Mark Dodson was not using for 3 months so may have a  component of substance-induced psychosis from cannabis if CBD derived such as delta 8, or if Mark Dodson is using regular cannabis daily as the acute effects can produce paranoia but usally dont cause substance induced psychosis on it own.    Plan to rule out medical causes of psychosis including syphilis and B12 deficiency.  Normal neuroexam so does not need head imaging urgently to rule out space-occupying lesion in CNS.  Recommend Mark Dodson detoxing and stays off alcohol given alcohol use disorder and liver impairment but more over 2 determining if there is a component of substance-induced psychosis from the alcohol.  Furthermore we will start Mark Dodson on Risperdal  1 mg to provide antipsychotic given functional impairment while also balancing a lower side effect profile.  Outpatient psychiatrist can adjust this as indicated.  Regarding alcohol withdrawal Mark Dodson is having a uncomplicated course on Librium  taper and we will continue this; no hx of severe withdrawal but has not had formal detox in past.  Will discuss naltrexone prior to discharge for alcohol use disorder.  Mark Dodson will need a PCP as Mark Dodson has not been to a doctor in many years. Liver enzymes slightly elevated and pt is aware that this is likely from alcohol use.     PLAN: Safety and Monitoring:             -- Voluntary admission to inpatient psychiatric unit for safety, stabilization and treatment             -- Daily contact with Mark Dodson to assess and evaluate symptoms and progress in treatment             -- Mark Dodson's case to be discussed in multi-disciplinary team meeting             -- Observation Level : q15 minute checks             -- Vital signs:  q12 hours             -- Precautions: suicide, elopement, and assault   2. Psychiatric Diagnoses and Treatment:  -- Start risperidone  1 mg once at night for psychosis unspecified  -- Continue trazodone  50 mg once nightly as needed for insomnia  -- Continue hydroxyzine  25 mg 3 times daily as  needed for anxiety  --  The risks/benefits/side-effects/alternatives to this medication were discussed in detail with the Mark Dodson and time was given for questions. The Mark Dodson consents to medication trial.              -- Metabolic profile and EKG monitoring obtained while on an atypical antipsychotic. See #4 below for values.              -- Encouraged Mark Dodson to participate in unit milieu and in scheduled group therapies              --  Short Term Goals: Ability to identify changes in lifestyle to reduce recurrence of condition will improve, Ability to verbalize feelings will improve, Ability to disclose and discuss suicidal ideas, Ability to demonstrate self-control will improve, Ability to identify and develop effective coping behaviors will improve, Ability to maintain clinical measurements within normal limits will improve, Compliance with prescribed medications will improve, and Ability to identify triggers associated with substance abuse/mental health issues will improve             -- Long Term Goals: Improvement in symptoms so as ready for discharge                3. Medical Issues Being Addressed:              -- Continue Librium  taper for alcohol withdrawal syndrome until Monday  -- Continue nicotine  patch 14 mg daily as needed and nicotine  gum as needed for nicotine  replacement; encouraged cessation    -- Continue PRN's: Tylenol , Maalox, Milk of Magnesia, agitation protocol, hydroxyzine  for anxiety, trazodone  for sleep, nicotine  replacement   4. Routine and other pertinent labs reviewed: EKG monitoring: QTc: 464  Metabolism / endocrine: BMI: Body mass index is 17.42 kg/m. Prolactin:  Lipid Panel: Lab Results  Component Value Date   CHOL 213 (H) 02/11/2024   TRIG 68 02/11/2024   HDL 122 02/11/2024   CHOLHDL 1.7 02/11/2024   VLDL 14 02/11/2024   LDLCALC 77 02/11/2024   HbgA1c: Hgb A1c MFr Bld (%)  Date Value  02/11/2024 5.9 (H)   TSH: TSH (uIU/mL)  Date Value   02/11/2024 0.454    Labs to order: RPR, B12, HIV to rule out psychosis; prolactin for baseline, given starting risperidone   5. Discharge Planning:              -- Social work and case management to assist with discharge planning and identification of hospital follow-up needs prior to discharge             -- Estimated LOS: 9/8 to allow for detox             -- Discharge Concerns: Need to establish a safety plan; Medication compliance and effectiveness             -- Discharge Goals: Return home with outpatient referrals for mental health follow-up including medication management/psychotherapy    I certify that inpatient services furnished can reasonably be expected to improve the Mark Dodson's condition.   This note was created using a voice recognition software as a result there may be grammatical errors inadvertently enclosed that do not reflect the nature of this encounter. Every attempt is made to correct such errors.   [name], MD PGY-2 Psychiatry Resident 02/13/2024, 1:32 PM

## 2024-02-13 NOTE — Progress Notes (Signed)
   02/13/24 1035  Psych Admission Type (Psych Patients Only)  Admission Status Voluntary  Psychosocial Assessment  Patient Complaints Anxiety  Eye Contact Fair  Facial Expression Anxious  Affect Anxious;Preoccupied  Speech Logical/coherent  Interaction Assertive  Motor Activity Other (Comment) (WNL)  Appearance/Hygiene In scrubs  Behavior Characteristics Cooperative;Anxious  Mood Anxious;Preoccupied  Thought Process  Coherency WDL  Content Paranoia (Pt stated,  I feel people are after me.)  Delusions Paranoid  Perception Hallucinations  Hallucination Auditory  Judgment Limited  Confusion None  Danger to Self  Current suicidal ideation? Denies  Agreement Not to Harm Self Yes  Description of Agreement Verbal  Danger to Others  Danger to Others None reported or observed
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
## 2024-02-13 NOTE — Group Note (Signed)
 Date:  02/13/2024 Time:  9:14 AM  Group Topic/Focus:  Goals Group:   The focus of this group is to help patients establish daily goals to achieve during treatment and discuss how the patient can incorporate goal setting into their daily lives to aide in recovery.    Participation Level:  Did Not Attend  Participation Quality:  NA  Affect:  NA  Cognitive:  NA  Insight: None  Engagement in Group:  NA  Modes of Intervention:  NA  Additional Comments:  NA  Quron Ruddy A Chaitra Mast 02/13/2024, 9:14 AM

## 2024-02-14 DIAGNOSIS — F209 Schizophrenia, unspecified: Principal | ICD-10-CM

## 2024-02-14 DIAGNOSIS — F1290 Cannabis use, unspecified, uncomplicated: Secondary | ICD-10-CM

## 2024-02-14 DIAGNOSIS — Z720 Tobacco use: Secondary | ICD-10-CM

## 2024-02-14 DIAGNOSIS — F10239 Alcohol dependence with withdrawal, unspecified: Secondary | ICD-10-CM

## 2024-02-14 LAB — RPR: RPR Ser Ql: NONREACTIVE

## 2024-02-14 LAB — HIV ANTIBODY (ROUTINE TESTING W REFLEX): HIV Screen 4th Generation wRfx: NONREACTIVE

## 2024-02-14 LAB — VITAMIN B12: Vitamin B-12: 534 pg/mL (ref 180–914)

## 2024-02-14 MED ORDER — RISPERIDONE 1 MG PO TABS: 1.0000 mg | ORAL_TABLET | Freq: Every day | ORAL | 0 refills | Status: AC

## 2024-02-14 NOTE — Progress Notes (Signed)
 D) Pt received calm, visible, participating in milieu, and in no acute distress. Pt A & O x4. Pt denies SI, HI, A/ V H, depression, anxiety and pain at this time. A) Pt encouraged to drink fluids. Pt encouraged to come to staff with needs. Pt encouraged to attend and participate in groups. Pt encouraged to set reachable goals.  R) Pt remained safe on unit, in no acute distress, will continue to assess.     02/13/24 2100  Psych Admission Type (Psych Patients Only)  Admission Status Voluntary  Psychosocial Assessment  Patient Complaints Anxiety  Eye Contact Fair  Facial Expression Anxious  Affect Anxious  Speech Logical/coherent  Interaction Assertive  Motor Activity Other (Comment) (WNL)  Appearance/Hygiene In scrubs  Behavior Characteristics Anxious  Mood Anxious  Thought Process  Coherency WDL  Content Paranoia  Delusions Paranoid  Perception Hallucinations  Hallucination Auditory  Judgment Limited  Confusion None  Danger to Self  Current suicidal ideation? Denies  Agreement Not to Harm Self Yes  Description of Agreement verbal  Danger to Others  Danger to Others None reported or observed

## 2024-02-14 NOTE — Progress Notes (Signed)
 CSW contacted [facility] located at [address], [place], [place] [address] at 10:50 AM [phone]  CSW attempted to schedule appointment with [facility] but was told their intake coordinator would not be back in office until Monday morning 02/15/24. CSW added appointment instructions for patient to set up services.   Louetta Lame, LCSW-A 02/14/24

## 2024-02-14 NOTE — Discharge Summary (Signed)
 Physician Discharge Summary Note  Patient:  Mark Dodson is an 43 y.o., male MRN:  [id number] DOB:  02-26-1981 Patient phone:  [phone] (home)  Patient address:   [address] [place] [address],  Total Time spent with patient: 30 minutes  Date of Admission:  02/12/2024 Date of Discharge: 02/14/2024  Reason for Admission:  AVH and paranoia  Principal Problem: Schizophrenia, unspecified (HCC) Discharge Diagnoses: Principal Problem:   Schizophrenia, unspecified (HCC) Active Problems:   Alcohol use disorder, severe, dependence (HCC)   Alcohol withdrawal (HCC)   Nicotine  use   Cannabis use  Mark Dodson is a 43 y.o. male  with no formal past psychiatric history. patient initially arrived to [facility] on 02/11/2024 for possible AVH and paranoia, and admitted to [facility] on 02/12/2024 for impaired functioning, substance related issues, and intensive therapeutic interventions.   Past Psychiatric Hx: Previous Psych Diagnoses: Patient reports no psychiatric history, 1 year ago there was a emergency department visit in which patient had similar AVH and paranoia but was discharged without treatment or documented psychiatric assessment. Prior inpatient treatment: Denies Current/prior outpatient treatment: Denies Prior rehab hx: Denies Psychotherapy hx: Denies History of suicide: Denies History of homicide or aggression: Denies Psychiatric medication history: Denies, none documented Neuromodulation history: Denies Current Psychiatrist: Never Current therapist: Never   Substance Abuse Hx: Alcohol: every day alcohol user. 2 beers plus 3-4 number of shots of unspecified quantity. Contemplative state of change.  Tobacco: Pack over 2 to 3 days Illicit drugs: THC only. Last reported use was 3 months ago (prior to paranoia worsening) However patient UDS positive for THC. Rx drug abuse: denies Rehab hx: no   Past Medical History: Medical Diagnoses: History of Bell's  palsy Home Rx: None Prior Hosp: Hallucinations, Bell's palsy Prior Surgeries/Trauma: Denies Head trauma, LOC, concussions, seizures: Denies Allergies: No allergies   Family History: Medical: Thyroid  cancer in his mother, CKD in father Psych: None Psych Rx: Denies SA/HA: Denies Substance use family hx: Alcohol use in both parents, THC use and family   Social History: Childhood (bring, raised, lives now, parents, siblings, schooling, education): Born and raised in [place], has lived in [place] over the last 6 or 7 years. Abuse: Denies Marital Status: Married for the last 4 years to [employer]  Currently separated. Sexual orientation: Prefers women Children: 1 daughter turning 7 on October 1 Employment: Works as a Animal nutritionist at [employer]: Good friends with brother-in-law Housing: Currently living with brother-in-law Finances: No current strain Hotel manager: No Administrator: denies any upcoming court dates.  Access to firearms: patient denies    Hospital course: Prior to admission patient was having about hallucinations and paranoia that people are outside his house and following him.  During hospitalization patient did not have these experiences.  Patient was having some mild withdrawal symptoms at [facility] for which she was started on Librium  taper and tolerated well.  He got to day 3/4 of the taper and was asking to leave.  Patient did not meet IVC criteria so he was allowed to discharge.  At that time he was having minimal withdrawal symptoms and denying AVH or paranoia.  He was started on risperidone  1 mg at night to balance treating his mild psychosis symptoms but also avoiding side effects of excessive medication.  Still unclear what caused his psychosis but best theory is alcohol induced.  Of note patient denied using cannabis but had positive UDS so may be component of  CBD derived cannabis which have been known to cause psychosis  independent of primary psychotic disorder.  Patient was agreeable to stopping alcohol given these issues and given that his liver enzymes were elevated.   During the patient's hospitalization, patient had extensive initial psychiatric evaluation, and follow-up psychiatric evaluations every day.  Psychiatric diagnoses provided upon initial assessment:  Psychosis unspecified Alcohol use disorder  Patient's psychiatric medications were adjusted on admission: Start risperidone  1 mg once at bedtime for psychosis   Patient's care was discussed during the interdisciplinary team meeting every day during the hospitalization.  The patient is not having side effects to prescribed psychiatric medication.  Gradually, patient started adjusting to milieu. The patient was evaluated each day by a clinical provider to ascertain response to treatment. Improvement was noted by the patient's report of decreasing symptoms, improved sleep and appetite, affect, medication tolerance, behavior, and participation in unit programming.  Patient was asked each day to complete a self inventory noting mood, mental status, pain, new symptoms, anxiety and concerns.   Symptoms were reported as significantly decreased or resolved completely by discharge.  The patient reports that their mood is stable.  The patient denied having suicidal thoughts for more than 48 hours prior to discharge.  Patient denies having homicidal thoughts.  Patient denies having auditory hallucinations.  Patient denies any visual hallucinations or other symptoms of psychosis.  The patient was motivated to continue taking medication with a goal of continued improvement in mental health.   The patient reports their target psychiatric symptoms of AVH and paranoia and alcohol withdrawal responded well to the psychiatric medications, and the patient reports overall benefit other psychiatric hospitalization. Supportive psychotherapy was provided to the patient.  The patient also participated in regular group therapy while hospitalized. Coping skills, problem solving as well as relaxation therapies were also part of the unit programming.  Labs were reviewed with the patient, and abnormal results were discussed with the patient.  The patient is able to verbalize their individual safety plan to this provider.  # It is recommended to the patient to continue psychiatric medications as prescribed, after discharge from the hospital.    # It is recommended to the patient to follow up with your outpatient psychiatric provider and PCP.  # It was discussed with the patient, the impact of alcohol, drugs, tobacco have been there overall psychiatric and medical wellbeing, and total abstinence from substance use was recommended the patient.ed.  # Prescriptions provided or sent directly to preferred pharmacy at discharge. Patient agreeable to plan. Given opportunity to ask questions. Appears to feel comfortable with discharge.    # In the event of worsening symptoms, the patient is instructed to call the crisis hotline, 911 and or go to the nearest ED for appropriate evaluation and treatment of symptoms. To follow-up with primary care provider for other medical issues, concerns and or health care needs  # Patient was discharged home with wife with a plan to follow up as noted below.    On day of discharge denies AVH and paranoia.  Reports that he wants to stop drinking but is not interested in naltrexone.  Denies symptoms of alcohol withdrawal including nausea, vomiting, diarrhea, diaphoresis, tremors, perceptual disturbances.  Denies thoughts to harm self or others.  Asking to leave the hospital despite recommendations of team to complete Ativan  taper for safe detox.   Physical Findings: AIMS: Facial and Oral Movements Muscles of Facial Expression: None Lips and Perioral Area: None Jaw: None Tongue: None,Extremity  Movements Upper (arms, wrists, hands,  fingers): None Lower (legs, knees, ankles, toes): None, Trunk Movements Neck, shoulders, hips: None, Global Judgements Severity of abnormal movements overall : None Incapacitation due to abnormal movements: None Patient's awareness of abnormal movements: No Awareness, Dental Status Current problems with teeth and/or dentures?: No Does patient usually wear dentures?: No Edentia?: No, Other Do movements disappear in sleep?: No, AIMS Total Score AIMS Total Score: 0  CIWA:  CIWA-Ar Total: 0   Musculoskeletal: Strength & Muscle Tone: within normal limits Gait & Station: normal Patient leans: N/A   Psychiatric Specialty Exam:  Presentation  General Appearance:  Appropriate for Environment  Eye Contact: Good  Speech: Normal Rate  Speech Volume: Normal  Handedness: Right   Mood and Affect  Mood: Euthymic  Affect: Appropriate; Congruent; Full Range   Thought Process  Thought Processes: Coherent; Linear  Descriptions of Associations:Intact  Orientation:Full (Time, Place and Person)  Thought Content:Logical  History of Schizophrenia/Schizoaffective disorder:No  Duration of Psychotic Symptoms:N/A  Hallucinations:Hallucinations: None Description of Auditory Hallucinations: n/a Description of Visual Hallucinations: n/a  Ideas of Reference:None  Suicidal Thoughts:Suicidal Thoughts: No  Homicidal Thoughts:Homicidal Thoughts: No   Sensorium  Memory: Immediate Good; Recent Good; Remote Good  Judgment: Good  Insight: Good   Executive Functions  Concentration: Good  Attention Span: Good  Recall: Good  Fund of Knowledge: Good  Language: Good   Psychomotor Activity  Psychomotor Activity: Psychomotor Activity: Normal   Assets  Assets: Social Support   Sleep  Sleep: Sleep: Good  Estimated Sleeping Duration (Last 24 Hours): 5.50-7.50 hours   Physical Exam: Physical Exam Vitals and nursing note reviewed.  HENT:     Head:  Normocephalic and atraumatic.  Pulmonary:     Effort: Pulmonary effort is normal.  Neurological:     General: No focal deficit present.     Mental Status: He is alert.  Psychiatric:     Comments: No obvious EPS.    Review of Systems  Constitutional:  Negative for fever.  Cardiovascular:  Negative for chest pain and palpitations.  Gastrointestinal:  Negative for constipation, diarrhea, nausea and vomiting.  Neurological:  Negative for dizziness, weakness and headaches.  Psychiatric/Behavioral:         Pt denies extrapyramidal symptoms including dystonia (sudden spastic contractions of muscle groups), parkinsonism (bradykinesia, tremors, rigidity), and akathisia (severe restlessness).    Blood pressure 127/88, pulse 60, temperature 97.6 F (36.4 C), resp. rate 16, height 6' 1 (1.854 m), weight 59.9 kg, SpO2 100%. Body mass index is 17.42 kg/m.   Social History   Tobacco Use  Smoking Status Every Day   Current packs/day: 0.25   Types: Cigarettes  Smokeless Tobacco Never   Tobacco Cessation:  A prescription for an FDA-approved tobacco cessation medication was offered at discharge and the patient refused   Blood Alcohol level:  Lab Results  Component Value Date   ETH 177 (H) 02/11/2024    Metabolic Disorder Labs:  Lab Results  Component Value Date   HGBA1C 5.9 (H) 02/11/2024   MPG 122.63 02/11/2024   No results found for: PROLACTIN Lab Results  Component Value Date   CHOL 213 (H) 02/11/2024   TRIG 68 02/11/2024   HDL 122 02/11/2024   CHOLHDL 1.7 02/11/2024   VLDL 14 02/11/2024   LDLCALC 77 02/11/2024    See Psychiatric Specialty Exam and Suicide Risk Assessment completed by Attending Physician prior to discharge.  Discharge destination:  Home  Is patient on multiple antipsychotic therapies at discharge:  No     Discharge Instructions     Diet - low sodium heart healthy   Complete by: As directed    Increase activity slowly   Complete by: As  directed       Allergies as of 02/14/2024   No Known Allergies      Medication List     TAKE these medications      Indication  risperiDONE  1 MG tablet Commonly known as: RISPERDAL  Take 1 tablet (1 mg total) by mouth at bedtime.  Indication: Delusions        Follow-up Information     [facility]. Go to.   Specialty: Behavioral Health Why: Please walk in for appointment with provider on 02/17/24 at 9:00 AM for medication management and therapy services. Contact information: [address] [place]  [phone]        [facility], [employer]. Go to.   Specialty: Professional Counselor Why: Please walk in for an assessment on 02/15/24 at 9:00 AM to establish therapy services. Contact information: [facility] [place] [address] [place]  [address] [place] [address] [phone]         Services, [facility]. Schedule an appointment as soon as possible for a visit.   Why: Please call 02/15/24 at 10:00 AM to schedule an appointment for assessment. Contact information: [address] [place] [address] [phone]                 Follow-up recommendations:   Activity: as tolerated   Diet: heart healthy   Other: -Follow-up with your outpatient psychiatric provider -instructions on appointment date, time, and address (location) are provided to you in discharge paperwork.   -Take your psychiatric medications as prescribed at discharge - instructions are provided to you in the discharge paperwork   -Follow-up with outpatient primary care doctor and other specialists -for management of chronic medical disease, including: none   -Testing: Follow-up with outpatient provider for abnormal lab results: will call patient with results of the remaining labs. Only lab to follow up is his mild transaminitis likely secondary to alcohol   -Recommend abstinence from alcohol, tobacco, and  other illicit drug use at discharge.    -If your psychiatric symptoms recur, worsen, or if you have side effects to your psychiatric medications, call your outpatient psychiatric provider, 911, 988 or go to the nearest emergency department.   -If suicidal thoughts recur, call your outpatient psychiatric provider, 911, 988 or go to the nearest emergency department.      Signed: [name], MD 02/14/2024, 10:57 AM

## 2024-02-14 NOTE — Discharge Instructions (Signed)
-  Follow-up with your outpatient psychiatric provider (and therapist) -instructions on appointment date, time, and address (location) are provided to you in discharge paperwork.  -Take your psychiatric medications as prescribed at discharge - instructions are provided to you in the discharge paperwork  -Follow-up with outpatient primary care doctor and other specialists -for management of preventative medicine and any chronic medical disease.  -Recommend abstinence from alcohol, tobacco, cannabis, and other substances at discharge.   -If your psychiatric symptoms recur, worsen, or if you have severe side effects to your psychiatric medications, call your outpatient psychiatric provider, 911, 988 (national suicide hotline), go to [facility], or go to the nearest emergency department.  -If suicidal thoughts occur, call your outpatient psychiatric provider, 911, 988 (national suicide hotline), go to [facility], or go to the nearest emergency department.  Naloxone (Narcan) can help reverse an overdose when given to the victim quickly.  [facility] offers free naloxone kits and instructions/training on its use.  Add naloxone to your first aid kit and you can help save a life.   Pick up your free kit at the following locations:   [place]:  [facility], [address] [place] [address] [phone]) [facility] [address] [place] [address] [phone]) [facility] [address] [place] [address]  [place]: [facility] [address] [place] [address] ([phone]) [facility] [address] [place] [address] [phone])    -----------------------------  [facility] OUTPATIENT Walk-in  information:  Please note, all walk-ins are first come & first serve, with limited number of availability.  Therapist for therapy:  Monday & Wednesdays: Please ARRIVE at 7:00 AM for registration Will START at 8:00 AM Every 1st & 2nd Friday of the month: Please ARRIVE at 10:00 AM for registration Will START at 1 PM - 5 PM Psychiatrist for medication management: Monday - Friday:  Please ARRIVE at 7:00 AM for registration Will START at 8:00 AM    Regretfully, due to limited availability, please be aware that you may not been seen on the same day as walk-in. Please consider making an appoint or try again. Thank you for your patience and understanding.  [facility] [address] [place]  [place], [place] [address] [phone]  New patients are seen at their walk-in clinic. Walk-in hours are Monday - Friday from 8:30 am - 12:00 pm, and from 1:00 pm - 2:30 pm.   Walk-in patients are seen on a first come, first served basis, so try to arrive as early as possible for the best chance of being seen the same day.

## 2024-02-14 NOTE — Progress Notes (Signed)
  [facility] Adult Case Management Discharge Plan :  Will you be returning to the same living situation after discharge:  Yes,  patient will return home. At discharge, do you have transportation home?: Yes,  patient's wife to transport. Do you have the ability to pay for your medications: Yes,  patient has insurance.  Release of information consent forms completed and in the chart;  Patient's signature needed at discharge.  Patient to Follow up at:  Follow-up Information     [facility]. Go to.   Specialty: Behavioral Health Why: Please walk in for appointment with provider on 02/17/24 at 9:00 AM for medication management and therapy services. Contact information: [address] [place]  [phone]        [facility], [employer]. Go to.   Specialty: Professional Counselor Why: Please walk in for an assessment on 02/15/24 at 9:00 AM to establish therapy services. Contact information: [facility] [place] [address] [place]  [address] [place] [address] [phone]         Services, [facility]. Schedule an appointment as soon as possible for a visit.   Why: Please call 02/15/24 at 10:00 AM to schedule an appointment for assessment. Contact information: [address] [place] [address] [phone]                 Next level of care provider has access to [facility] Link:no  Safety Planning and Suicide Prevention discussed: Yes,  SPE completed with wife Bastion Bolger [phone].    Has patient been referred to the Quitline?: Patient refused referral for treatment  Patient has been referred for addiction treatment: Patient provided information for [facility] for alcohol detox as requested. Intake coordinator would not be back in office until Monday morning 02/15/24.   Hunter JONELLE Lever, LCSWA 02/14/2024, 1:39 PM

## 2024-02-14 NOTE — Group Note (Signed)
 Date:  02/14/2024 Time:  10:11 AM  Group Topic/Focus:  Goals Group:   The focus of this group is to help patients establish daily goals to achieve during treatment and discuss how the patient can incorporate goal setting into their daily lives to aide in recovery.    Participation Level:  Active  Participation Quality:  Appropriate and Attentive  Affect:  Appropriate  Cognitive:  Alert and Appropriate  Insight: Appropriate and Improving  Engagement in Group:  Engaged and Improving  Modes of Intervention:  Discussion and Exploration  Additional Comments:  Pt attended and participated in goals group.  Kristi HERO Nikolai Wilczak 02/14/2024, 10:11 AM

## 2024-02-14 NOTE — Progress Notes (Addendum)
(  Sleep Hours) - 5.5 (Any PRNs that were needed, meds refused, or side effects to meds)- trazodone  for sleep (Any disturbances and when (visitation, over night)-none (Concerns raised by the patient)- none (SI/HI/AVH)- denies

## 2024-02-14 NOTE — Group Note (Signed)
 Date:  02/14/2024 Time:  10:11 AM  Group Topic/Focus:  Coping Mechanisms This group focused on coping mechanisms and understanding the importance of developing healthier ways to manage stress, emotional distress, and mental health challenges. The group explored healthy vs. nonhealthy coping mechanisms, emphasizing how some behaviors like substance abuse or avoidance can worsen mental health, while others like adequate sleep and food and relaxation techniques can lead to better emotional regulation and overall well-being.  A worksheet was provided to participants and highlighted adaptive vs. maladaptive coping mechanisms. Participants were encouraged to share their personal experiences and engage in conversation focused on resilience, improving self-awareness, and practicing deep breathing as a positive coping mechanism.  Participation Level:  Active  Participation Quality:  Appropriate and Attentive  Affect:  Appropriate  Cognitive:  Alert and Appropriate  Insight: Appropriate and Improving  Engagement in Group:  Engaged and Improving  Modes of Intervention:  Discussion, Education, and Problem-solving  Additional Comments:  Pt attended and participated in this group.  Kristi HERO Tiyon Sanor 02/14/2024, 10:11 AM

## 2024-02-14 NOTE — Progress Notes (Signed)
 Patient verbalizes readiness for discharge. All patient belongings returned to patient. Discharge instructions read and discussed with patient (appointments, medications, resources). Patient expressed gratitude for care provided. Patient discharged to lobby at 49 where his wife was waiting.

## 2024-02-14 NOTE — [facility] Suicide Risk Assessment (Signed)
 Suicide Risk Assessment  Discharge Assessment    [facility] Discharge Suicide Risk Assessment   Principal Problem: Schizophrenia, unspecified (HCC) Discharge Diagnoses: Principal Problem:   Schizophrenia, unspecified (HCC) Active Problems:   Alcohol use disorder, severe, dependence (HCC)   Alcohol withdrawal (HCC)   Nicotine  use   Cannabis use  Mark Dodson is a 43 y.o. male  with no formal past psychiatric history. patient initially arrived to [facility] on 02/11/2024 for possible AVH and paranoia, and admitted to [facility] on 02/12/2024 for impaired functioning, substance related issues, and intensive therapeutic interventions.   Denies suicidal thoughts today.  Denies significant alcohol drawl symptoms including nausea, vomiting, diarrhea, diaphoresis, tremors.  Reports good mood.  Reports he is hopeful for getting help.  Denies any AVH or paranoia.  Total Time spent with patient: 45 minutes  Musculoskeletal: Strength & Muscle Tone: within normal limits Gait & Station: normal Patient leans: N/A  Psychiatric Specialty Exam  Presentation  General Appearance:  Appropriate for Environment  Eye Contact: Good  Speech: Normal Rate  Speech Volume: Normal  Handedness: Right   Mood and Affect  Mood: Euthymic  Duration of Depression Symptoms: No data recorded Affect: Appropriate; Congruent; Full Range   Thought Process  Thought Processes: Coherent; Linear  Descriptions of Associations:Intact  Orientation:Full (Time, Place and Person)  Thought Content:Logical  History of Schizophrenia/Schizoaffective disorder:No  Duration of Psychotic Symptoms:N/A  Hallucinations:Hallucinations: None Description of Auditory Hallucinations: n/a Description of Visual Hallucinations: n/a  Ideas of Reference:None  Suicidal Thoughts:Suicidal Thoughts: No  Homicidal Thoughts:Homicidal Thoughts: No   Sensorium  Memory: Immediate Good; Recent Good; Remote  Good  Judgment: Good  Insight: Good   Executive Functions  Concentration: Good  Attention Span: Good  Recall: Good  Fund of Knowledge: Good  Language: Good   Psychomotor Activity  Psychomotor Activity: Psychomotor Activity: Normal   Assets  Assets: Social Support   Sleep  Sleep: Sleep: Good  Estimated Sleeping Duration (Last 24 Hours): 5.50-7.50 hours  Physical Exam: Physical Exam Vitals and nursing note reviewed.  HENT:     Head: Normocephalic and atraumatic.  Pulmonary:     Effort: Pulmonary effort is normal.  Neurological:     General: No focal deficit present.     Mental Status: He is alert.  Psychiatric:     Comments: No obvious EPS.    Review of Systems  Constitutional:  Negative for diaphoresis and fever.  Cardiovascular:  Negative for chest pain and palpitations.  Gastrointestinal:  Negative for constipation, diarrhea, nausea and vomiting.  Neurological:  Negative for dizziness, tremors, weakness and headaches.   Blood pressure 127/88, pulse 60, temperature 97.6 F (36.4 C), resp. rate 16, height 6' 1 (1.854 m), weight 59.9 kg, SpO2 100%. Body mass index is 17.42 kg/m.  Mental Status Per Nursing Assessment::   On Admission:  NA  Demographic Factors:  NA  Loss Factors: NA  Historical Factors: NA  Risk Reduction Factors:   Sense of responsibility to family, Positive social support, Positive therapeutic relationship, and Positive coping skills or problem solving skills  Continued Clinical Symptoms:  None currently.   Cognitive Features That Contribute To Risk:  None    Suicide Risk:  Minimal: No identifiable suicidal ideation.  Patients presenting with no risk factors but with morbid ruminations; may be classified as minimal risk based on the severity of the depressive symptoms    Plan Of Care/Follow-up recommendations:  Activity: as tolerated  Diet: heart healthy  Other: -Follow-up with your outpatient  psychiatric provider -  instructions on appointment date, time, and address (location) are provided to you in discharge paperwork.  -Take your psychiatric medications as prescribed at discharge - instructions are provided to you in the discharge paperwork  -Follow-up with outpatient primary care doctor and other specialists -for management of chronic medical disease, including: none  -Testing: Follow-up with outpatient provider for abnormal lab results: will call patient with results of the remaining labs. Only lab to follow up is his mild transaminitis likely secondary to alcohol  -Recommend abstinence from alcohol, tobacco, and other illicit drug use at discharge.   -If your psychiatric symptoms recur, worsen, or if you have side effects to your psychiatric medications, call your outpatient psychiatric provider, 911, [phone] or go to the nearest emergency department.  -If suicidal thoughts recur, call your outpatient psychiatric provider, 911, [phone] or go to the nearest emergency department.   Mark Cornish, MD 02/14/2024, 10:33 AM

## 2024-02-16 LAB — PROLACTIN: Prolactin: 26.8 ng/mL — ABNORMAL HIGH (ref 3.9–22.7)
